# Patient Record
Sex: Male | Born: 1970 | Race: White | Hispanic: No | Marital: Married | Smoking: Never smoker
Health system: Southern US, Community
[De-identification: ages and names within clinical notes are randomized; demographics above are authoritative.]

## PROBLEM LIST (undated history)

## (undated) DIAGNOSIS — Z85828 Personal history of other malignant neoplasm of skin: Secondary | ICD-10-CM

## (undated) DIAGNOSIS — B009 Herpesviral infection, unspecified: Secondary | ICD-10-CM

## (undated) DIAGNOSIS — E785 Hyperlipidemia, unspecified: Secondary | ICD-10-CM

## (undated) DIAGNOSIS — I10 Essential (primary) hypertension: Secondary | ICD-10-CM

## (undated) HISTORY — DX: Personal history of other malignant neoplasm of skin: Z85.828

## (undated) HISTORY — DX: Hyperlipidemia, unspecified: E78.5

## (undated) HISTORY — DX: Herpesviral infection, unspecified: B00.9

## (undated) HISTORY — DX: Essential (primary) hypertension: I10

---

## 1997-05-31 DIAGNOSIS — I10 Essential (primary) hypertension: Secondary | ICD-10-CM

## 1997-05-31 HISTORY — DX: Essential (primary) hypertension: I10

## 1998-08-05 ENCOUNTER — Encounter: Payer: Self-pay | Admitting: Family Medicine

## 1998-08-05 LAB — CONVERTED CEMR LAB: Blood Glucose, Fasting: 86 mg/dL

## 2000-03-31 ENCOUNTER — Encounter: Payer: Self-pay | Admitting: Family Medicine

## 2001-12-29 DIAGNOSIS — E785 Hyperlipidemia, unspecified: Secondary | ICD-10-CM

## 2001-12-29 HISTORY — DX: Hyperlipidemia, unspecified: E78.5

## 2002-01-10 ENCOUNTER — Encounter: Payer: Self-pay | Admitting: Family Medicine

## 2002-01-10 LAB — CONVERTED CEMR LAB: Blood Glucose, Fasting: 96 mg/dL

## 2003-04-08 ENCOUNTER — Encounter: Payer: Self-pay | Admitting: Family Medicine

## 2003-04-08 LAB — CONVERTED CEMR LAB: Blood Glucose, Fasting: 91 mg/dL

## 2004-05-14 ENCOUNTER — Encounter: Payer: Self-pay | Admitting: Family Medicine

## 2004-05-14 LAB — CONVERTED CEMR LAB
Blood Glucose, Fasting: 89 mg/dL
RBC count: 5.06 10*6/uL
WBC, blood: 4.9 10*3/uL

## 2005-07-14 ENCOUNTER — Ambulatory Visit: Payer: Self-pay | Admitting: Family Medicine

## 2005-10-10 ENCOUNTER — Ambulatory Visit: Payer: Self-pay | Admitting: Family Medicine

## 2005-10-10 LAB — CONVERTED CEMR LAB: WBC, blood: 5.7 10*3/uL

## 2005-10-11 ENCOUNTER — Ambulatory Visit: Payer: Self-pay | Admitting: Family Medicine

## 2006-01-06 ENCOUNTER — Ambulatory Visit: Payer: Self-pay | Admitting: Family Medicine

## 2006-07-11 ENCOUNTER — Ambulatory Visit: Payer: Self-pay | Admitting: Family Medicine

## 2006-07-17 ENCOUNTER — Ambulatory Visit: Payer: Self-pay | Admitting: Family Medicine

## 2006-10-12 ENCOUNTER — Ambulatory Visit: Payer: Self-pay | Admitting: Family Medicine

## 2006-10-16 ENCOUNTER — Ambulatory Visit: Payer: Self-pay | Admitting: Family Medicine

## 2007-10-11 ENCOUNTER — Ambulatory Visit: Payer: Self-pay | Admitting: Family Medicine

## 2007-10-11 ENCOUNTER — Encounter: Payer: Self-pay | Admitting: Family Medicine

## 2007-10-11 DIAGNOSIS — Z85828 Personal history of other malignant neoplasm of skin: Secondary | ICD-10-CM | POA: Insufficient documentation

## 2007-10-11 DIAGNOSIS — E7800 Pure hypercholesterolemia, unspecified: Secondary | ICD-10-CM | POA: Insufficient documentation

## 2007-10-11 DIAGNOSIS — B009 Herpesviral infection, unspecified: Secondary | ICD-10-CM | POA: Insufficient documentation

## 2007-10-11 DIAGNOSIS — I10 Essential (primary) hypertension: Secondary | ICD-10-CM | POA: Insufficient documentation

## 2007-10-11 LAB — CONVERTED CEMR LAB
Calcium: 9.5 mg/dL (ref 8.4–10.5)
Cholesterol: 172 mg/dL (ref 0–200)
GFR calc Af Amer: 88 mL/min
GFR calc non Af Amer: 72 mL/min
Glucose, Bld: 96 mg/dL (ref 70–99)
HDL: 48.4 mg/dL (ref >39.0)
LDL Cholesterol: 109 mg/dL — ABNORMAL HIGH (ref 0–99)
Potassium: 4.8 meq/L (ref 3.5–5.1)
Sodium: 140 meq/L (ref 135–145)
Total CHOL/HDL Ratio: 3.6
Triglycerides: 73 mg/dL (ref 0–149)

## 2007-10-22 ENCOUNTER — Ambulatory Visit: Payer: Self-pay | Admitting: Family Medicine

## 2008-06-19 ENCOUNTER — Ambulatory Visit: Payer: Self-pay | Admitting: Family Medicine

## 2008-06-19 DIAGNOSIS — M79609 Pain in unspecified limb: Secondary | ICD-10-CM | POA: Insufficient documentation

## 2008-10-20 ENCOUNTER — Encounter: Payer: Self-pay | Admitting: Family Medicine

## 2008-10-27 ENCOUNTER — Ambulatory Visit: Payer: Self-pay | Admitting: Family Medicine

## 2008-10-27 LAB — CONVERTED CEMR LAB
ALT: 28 units/L (ref 0–53)
AST: 33 units/L (ref 0–37)
Bilirubin, Direct: 0.1 mg/dL (ref 0.0–0.3)
CO2: 31 meq/L (ref 19–32)
Chloride: 107 meq/L (ref 96–112)
GFR calc non Af Amer: 100 mL/min
LDL Cholesterol: 95 mg/dL (ref 0–99)
Potassium: 4.5 meq/L (ref 3.5–5.1)
Sodium: 140 meq/L (ref 135–145)
Total Bilirubin: 0.9 mg/dL (ref 0.3–1.2)
Total CHOL/HDL Ratio: 2.8

## 2008-10-29 ENCOUNTER — Ambulatory Visit: Payer: Self-pay | Admitting: Family Medicine

## 2008-12-05 ENCOUNTER — Encounter: Payer: Self-pay | Admitting: Family Medicine

## 2008-12-05 HISTORY — PX: VASECTOMY: SHX75

## 2009-09-18 ENCOUNTER — Ambulatory Visit: Payer: Self-pay | Admitting: Family Medicine

## 2009-09-21 ENCOUNTER — Ambulatory Visit: Payer: Self-pay | Admitting: Internal Medicine

## 2009-09-23 ENCOUNTER — Encounter: Payer: Self-pay | Admitting: Family Medicine

## 2009-10-28 ENCOUNTER — Ambulatory Visit: Payer: Self-pay | Admitting: Family Medicine

## 2009-10-28 LAB — CONVERTED CEMR LAB
ALT: 40 units/L (ref 0–53)
Albumin: 4.2 g/dL (ref 3.5–5.2)
Alkaline Phosphatase: 47 units/L (ref 39–117)
Basophils Relative: 0.2 % (ref 0.0–3.0)
Bilirubin, Direct: 0.1 mg/dL (ref 0.0–0.3)
CO2: 30 meq/L (ref 19–32)
Calcium: 9.2 mg/dL (ref 8.4–10.5)
Chloride: 105 meq/L (ref 96–112)
Creatinine, Ser: 1 mg/dL (ref 0.4–1.5)
Eosinophils Relative: 2.3 % (ref 0.0–5.0)
Glucose, Bld: 89 mg/dL (ref 70–99)
HCT: 44.1 % (ref 39.0–52.0)
HDL: 56.7 mg/dL (ref >39.00)
Hemoglobin: 14.6 g/dL (ref 13.0–17.0)
Lymphs Abs: 1.6 10*3/uL (ref 0.7–4.0)
MCV: 94.7 fL (ref 78.0–100.0)
Monocytes Absolute: 0.4 10*3/uL (ref 0.1–1.0)
Monocytes Relative: 8.3 % (ref 3.0–12.0)
Neutro Abs: 2.6 10*3/uL (ref 1.4–7.7)
Platelets: 183 10*3/uL (ref 150.0–400.0)
Total CHOL/HDL Ratio: 3
Total Protein: 6.9 g/dL (ref 6.0–8.3)
WBC: 4.7 10*3/uL (ref 4.5–10.5)

## 2009-11-02 ENCOUNTER — Ambulatory Visit: Payer: Self-pay | Admitting: Family Medicine

## 2010-06-08 ENCOUNTER — Encounter (INDEPENDENT_AMBULATORY_CARE_PROVIDER_SITE_OTHER): Payer: Self-pay | Admitting: *Deleted

## 2010-09-09 ENCOUNTER — Ambulatory Visit: Payer: Self-pay | Admitting: Family Medicine

## 2010-09-09 DIAGNOSIS — J020 Streptococcal pharyngitis: Secondary | ICD-10-CM | POA: Insufficient documentation

## 2010-10-01 ENCOUNTER — Ambulatory Visit: Payer: Self-pay | Admitting: Internal Medicine

## 2010-10-28 ENCOUNTER — Telehealth (INDEPENDENT_AMBULATORY_CARE_PROVIDER_SITE_OTHER): Payer: Self-pay | Admitting: *Deleted

## 2010-11-02 ENCOUNTER — Ambulatory Visit
Admission: RE | Admit: 2010-11-02 | Discharge: 2010-11-02 | Payer: Self-pay | Source: Home / Self Care | Attending: Family Medicine | Admitting: Family Medicine

## 2010-11-02 LAB — CONVERTED CEMR LAB
AST: 110 units/L — ABNORMAL HIGH (ref 0–37)
Albumin: 4.3 g/dL (ref 3.5–5.2)
BUN: 26 mg/dL — ABNORMAL HIGH (ref 6–23)
Calcium: 9.5 mg/dL (ref 8.4–10.5)
Cholesterol: 185 mg/dL (ref 0–200)
Creatinine, Ser: 0.9 mg/dL (ref 0.4–1.5)
GFR calc non Af Amer: 101.75 mL/min (ref >60.00)
Glucose, Bld: 87 mg/dL (ref 70–99)
HDL: 56.8 mg/dL (ref >39.00)
Triglycerides: 38 mg/dL (ref 0.0–149.0)
VLDL: 7.6 mg/dL (ref 0.0–40.0)

## 2010-11-04 ENCOUNTER — Ambulatory Visit
Admission: RE | Admit: 2010-11-04 | Discharge: 2010-11-04 | Payer: Self-pay | Source: Home / Self Care | Attending: Family Medicine | Admitting: Family Medicine

## 2010-11-30 NOTE — Assessment & Plan Note (Signed)
Summary: CPX/CLE   Vital Signs:  Patient profile:   40 year old male Weight:      226 pounds BMI:     29.12 Temp:     98.7 degrees F oral Pulse rate:   68 / minute Pulse rhythm:   regular BP sitting:   138 / 80  (left arm) Cuff size:   regular  Vitals Entered By: Linde Gillis CMA Duncan Dull) (November 02, 2009 8:14 AM) CC: 30 minute exam, tetanus given today   History of Present Illness: Nicholas Vasquez here for Comp Exam...having stress at work.  When yawning recently has had pain in left ear. He has also had some recurrent Sebaceous activity of the left forearm and right posterior neck.  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 Prn 2)  Norvasc 5 Mg  Tabs (Amlodipine Besylate) .Marland Kitchen.. 1 Daily By Mouth 3)  Lipitor 20 Mg  Tabs (Atorvastatin Calcium) .... 1/2 Tablet At Bedtime  Allergies (verified): No Known Drug Allergies  Past History:  Past Medical History: Last updated: 10/11/2007 Hyperlipidemia:(12/2001) Hypertension:(05/1997)  Past Surgical History: Last updated: 10/29/2008 Vas (Dr Achilles Dunk) 12/05/08  Family History: Last updated: 11/02/2009 Father: A 69 STROKE CABG IN 1993-- CHF// HEART TRANSPLANT [employer] 1998 CHOLEY PROSTATE CANCER AT 40 YOA ? RADIATION Mother: ALIVE 71   GRANDPARENTS : MATERNALGM A 95 MGF 10-16-93// PGM DECEASED RENAL FAILURE SISTER A 43 CHOL CV:  +HEART TRANSPLANT + CABG X 4 -- CHF (FATHER PGF CAD'S 50'S HBP: NEGATIVE DM: NEGATIVE GOUT/ARTHRITIS: PROSTATE CANCER: + FATHER BREAST/OVARIAN/UTERINE CANCER: NEGATIVE COLON CANCER: DEPRESSION: NEGATIVE ETOH/DRUG ABUSE: NEGATIVE OTHER: STROKE NEGATIVE  Social History: Last updated: 10/29/2008 Marital Status: Married  1996 Children: 2  CHILDREN 6     4 Occupation: SYSTEMS ENGINEER, [employer]  Risk Factors: Alcohol Use: <1 (11/02/2009) Caffeine Use: 3 (11/02/2009) Exercise: yes (11/02/2009)  Risk Factors: Smoking Status: never (11/02/2009) Passive Smoke Exposure: no (11/02/2009)  Family History: Father: A 32 STROKE CABG IN 1993-- CHF// HEART TRANSPLANT [employer] 1998 CHOLEY PROSTATE CANCER AT 23 YOA ? RADIATION Mother: ALIVE 72   GRANDPARENTS : MATERNALGM A 95 MGF 1993-10-16// PGM DECEASED RENAL FAILURE SISTER A 43 CHOL CV: +HEART TRANSPLANT + CABG X 4 -- CHF (FATHER PGF CAD'S 50'S HBP: NEGATIVE DM: NEGATIVE GOUT/ARTHRITIS: PROSTATE CANCER: + FATHER BREAST/OVARIAN/UTERINE CANCER: NEGATIVE COLON CANCER: DEPRESSION: NEGATIVE ETOH/DRUG ABUSE: NEGATIVE OTHER: STROKE NEGATIVE  Review of Systems General:  Denies chills, fatigue, fever, sweats, weakness, and weight loss. Eyes:  Denies blurring, discharge, and eye pain. ENT:  Complains of earache; denies decreased hearing and ringing in ears; occas per hpi. CV:  Denies chest pain or discomfort, difficulty breathing while lying down, fainting, fatigue, palpitations, and shortness of breath with exertion. Resp:  Denies cough, shortness of breath, and wheezing. GI:  Denies abdominal pain, bloody stools, change in bowel habits, constipation, dark tarry stools, diarrhea, indigestion, loss of appetite, nausea, vomiting, vomiting blood, and yellowish skin color. GU:  Denies discharge, dysuria, nocturia, and urinary frequency. MS:  Complains of muscle aches; denies joint pain, low back pain, and cramps; LEFT POST NECK. Derm:   Denies dryness, itching, and rash; sEE hpi. Neuro:  Complains of numbness; denies poor balance, tingling, and tremors; LEFT FINGERS OFF AND ON. ASSSOC W/ NECK.Marland Kitchen  Physical Exam  General:  Well-developed,well-nourished,in no acute distress; alert,appropriate and cooperative throughout examination Head:  Normocephalic and atraumatic without obvious abnormalities. No apparent alopecia or balding. Sinuses NT. Eyes:  Conjunctiva clear bilaterally.  Ears:  External ear exam shows no significant lesions or deformities.  Otoscopic examination reveals clear canals, tympanic membranes are intact bilaterally without bulging, retraction, inflammation or discharge. Hearing is grossly normal bilaterally. Nose:  External nasal examination shows no deformity or inflammation. Nasal mucosa are pink and moist without lesions or exudates. Mouth:  Oral mucosa and oropharynx without lesions or exudates.  Teeth in good repair. Neck:  No deformities, masses, or tenderness noted. Chest Wall:  No deformities, masses, tenderness or gynecomastia noted. Breasts:  No masses or gynecomastia noted Lungs:  Normal respiratory effort, chest expands symmetrically. Lungs are clear to auscultation, no crackles or wheezes. Heart:  Normal rate and regular rhythm. S1 and S2 normal without gallop, murmur, click, rub or other extra sounds. Abdomen:  Bowel sounds positive,abdomen soft and non-tender without masses, organomegaly or hernias noted. Rectal:  No external abnormalities noted. Normal sphincter tone. No rectal masses or tenderness. G neg. Genitalia:  Testes bilaterally descended without nodularity, tenderness or masses. No scrotal masses or lesions except varicocele on the left.  No penis lesions or urethral discharge. Prostate:  Prostate gland firm and smooth, no enlargement, nodularity, tenderness, mass, asymmetry or induration. 20gms. Msk:  No deformity or scoliosis noted of thoracic or lumbar spine.   Pulses:  R and L  carotid,radial,femoral,dorsalis pedis and posterior tibial pulses are full and equal bilaterally Extremities:  No clubbing, cyanosis, edema, or deformity noted with normal full range of motion of all joints.   Neurologic:  No cranial nerve deficits noted. Station and gait are normal. Plantar reflexes are down-going bilaterally. DTRs are symmetrical throughout. Sensory, motor and coordinative functions appear intact. Skin:  Intact without suspicious lesions or rashes. Three isolated lesions that appear to be very small resolving Sebaceous cysts Cervical Nodes:  No lymphadenopathy noted Inguinal Nodes:  No significant adenopathy Psych:  Cognition and judgment appear intact. Alert and cooperative with normal attention span and concentration. No apparent delusions, illusions, hallucinations   Impression & Recommendations:  Problem # 1:  HEALTH MAINTENANCE EXAM (ICD-V70.0) Tdap today.  Problem # 2:  ARM PAIN, LEFT (ICD-729.5) Assessment: Unchanged Stable, recurs at times as flare.  Problem # 3:  HERPES SIMPLEX INFECTION, RECURRENT (ICD-054.9) Assessment: Unchanged Sent Valtrex script.  Problem # 4:  HYPERCHOLESTEROLEMIA (ICD-272.0) Assessment: Unchanged  Adequate on current meds. His updated medication list for this problem includes:    Lipitor 20 Mg Tabs (Atorvastatin calcium) .Marland Kitchen... 1/2 tablet at bedtime  Labs Reviewed: SGOT: 44 (10/28/2009)   SGPT: 40 (10/28/2009)   HDL:56.70 (10/28/2009), 56.9 (10/27/2008)  LDL:124 (10/28/2009), 95 (04/54/0981)  Chol:193 (10/28/2009), 159 (10/27/2008)  Trig:60.0 (10/28/2009), 34 (10/27/2008)  Problem # 5:  HYPERTENSION (ICD-401.9) Assessment: Unchanged OK control. Continue. His updated medication list for this problem includes:    Norvasc 5 Mg Tabs (Amlodipine besylate) .Marland Kitchen... 1 daily  by mouth  BP today: 138/80 Prior BP: 120/80 (10/29/2008)  Labs Reviewed: K+: 4.5 (10/28/2009) Creat: : 1.0 (10/28/2009)   Chol: 193 (10/28/2009)   HDL: 56.70  (10/28/2009)   LDL: 124 (10/28/2009)   TG: 60.0 (10/28/2009)  Complete Medication List: 1)  Valtrex 500 Mg Tabs (Valacyclovir hcl) .... One tab by mouth qd 2)  Norvasc 5 Mg Tabs (Amlodipine besylate) .Marland Kitchen.. 1 daily by mouth 3)  Lipitor 20 Mg Tabs (Atorvastatin calcium) .... 1/2 tablet at bedtime  Other Orders: Tdap => 70yrs IM (04540) Admin 1st Vaccine (98119)  Patient Instructions: 1)  RTC as needed. One year otherwise. Prescriptions: VALTREX 500 MG  TABS (VALACYCLOVIR HCL) ONE TAB by mouth QD  #90 x 4   Entered and Authorized by:   Shaune Leeks MD   Signed by:   Shaune Leeks MD on 11/02/2009   Method used:   Print then Give to Patient   RxID:   [id number] LIPITOR 20 MG  TABS (ATORVASTATIN CALCIUM) 1/2 tablet at bedtime  #45 x 4   Entered and Authorized by:   Shaune Leeks MD   Signed by:   Shaune Leeks MD on 11/02/2009   Method used:   Print then Give to Patient   RxID:   [id number] NORVASC 5 MG  TABS (AMLODIPINE BESYLATE) 1 daily by mouth  #90 x 4   Entered and Authorized by:   Shaune Leeks MD   Signed by:   Shaune Leeks MD on 11/02/2009   Method used:   Print then Give to Patient   RxID:   [id number]   Current Allergies (reviewed today): No known allergies    Immunizations Administered:  Tetanus Vaccine:    Vaccine Type: Tdap    Site: left deltoid    Mfr: GlaxoSmithKline    Dose: 0.5 ml    Route: IM    Given by: Linde Gillis CMA (AAMA)    Exp. Date: 12/26/2011    Lot #: [id number]    VIS given: 09/18/07 version given November 02, 2009.

## 2010-11-30 NOTE — Assessment & Plan Note (Signed)
Summary: STREP THROAT  CYD   Vital Signs:  Patient profile:   40 year old male Height:      74 inches Weight:      213 pounds BMI:     27.45 Temp:     98.3 degrees F oral Pulse rate:   64 / minute Pulse rhythm:   regular BP sitting:   122 / 70  (left arm) Cuff size:   large  Vitals Entered By: Delilah Shan CMA Duncan Dull) (September 09, 2010 9:07 AM) CC: ? Strep throat   History of Present Illness: Daughter with strep.  Sx started Monday.  ST, fever Tuesday.  Noted LA per patient.  No cough.  Taking ibuprofen and this helps temporarily.   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  Otherwise negative.    Physical Exam  General:  GEN: nad, alert and oriented HEENT: mucous membranes moist, TM w/o erythema, nasal epithelium not injected, OP with cobblestoning and tonsillar exudates NECK: supple w/LA that is tender to palpation  CV: rrr. PULM: ctab, no inc wob   Impression & Recommendations:  Problem # 1:  STREPTOCOCCAL PHARYNGITIS (ICD-034.0) Strep.  Tolerated amoxil prev.  NKDA.  Supportive tx.  NSAIDS precaution.  follow up as needed.  He agrees.  His updated medication list for this problem includes:    Amoxicillin 875 Mg Tabs (Amoxicillin) .Marland Kitchen... 1 by mouth two times a day x10days    Lidocaine Viscous 2 % Soln (Lidocaine hcl) .Marland KitchenMarland KitchenMarland KitchenMarland Kitchen 5ml swish and spit out every 4 hours as needed for sore throat  Complete Medication List: 1)  Valtrex 500 Mg Tabs (Valacyclovir hcl) .... One tab by mouth once daily as needed 2)  Norvasc 5 Mg Tabs (Amlodipine besylate) .Marland Kitchen.. 1 daily by mouth 3)  Lipitor 20 Mg Tabs (Atorvastatin calcium) .... 1/2 tablet at bedtime 4)  Multivitamins Tabs (Multiple vitamin) .... Take 1 tablet by mouth once a day 5)  Amoxicillin 875 Mg Tabs (Amoxicillin) .Marland Kitchen.. 1 by mouth two times a day  x10days 6)  Lidocaine Viscous 2 % Soln (Lidocaine hcl) .... 5ml swish and spit out every 4 hours as needed for sore throat  Patient Instructions: 1)  Start the antibiotics today, use ibuprofen for pain, and try to get some rest.  Keep drinking plenty of fluids and take the ibuprofen with food.  I would use the lidocaine for your throat as needed.  Warm salt water gargles can also help with pain.  Take care.  Prescriptions: LIDOCAINE VISCOUS 2 % SOLN (LIDOCAINE HCL) 5ml swish and spit out every 4 hours as needed for sore throat  #15ml x 0   Entered and Authorized by:   Crawford Givens MD   Signed by:   Crawford Givens MD on 09/09/2010   Method used:   [facility]ron[facility] to        [facility] [phone]* (retail)       [address] [place], [place]  [address]       Ph: [phone] or [phone]       Fax: [phone]   RxID:   [id number] AMOXICILLIN 875 MG TABS (AMOXICILLIN) 1 by mouth two times a day x10days  #20 x 0  Entered and Authorized by:   Crawford Givens MD   Signed by:   Crawford Givens MD on 09/09/2010   Method used:   [facility] to        [facility] [phone]* (retail)       [address] [place], [place]  [address]       Ph: [phone] or [phone]       Fax: [phone]   RxID:   [id number]    Orders Added: 1)  Est. Patient Level III [74259]    Current Allergies (reviewed today): No known allergies   Laboratory Results  Date/Time Received: September 09, 2010 9:22 AM   Other Tests  Rapid Strep: positive

## 2010-11-30 NOTE — Letter (Signed)
Summary: Nicholas Vasquez letter  [facility]  [address] [place], [place] [address]   Phone: [phone]  Fax: [phone]       06/08/2010 MRN: [id number]  Nicholas Vasquez [address] [place], [place]  [address]  Dear Mr. Nicholas Vasquez [facility] [place], and [facility] announce the retirement of Arta Silence, M.D., from full-time practice at the [facility] office effective April 29, 2010 and his plans of returning part-time.  It is important to Dr. Hetty Ely and to our practice that you understand that [facility] has seven physicians in our office for your health care needs.  We will continue to offer the same exceptional care that you have today.    Dr. Hetty Ely has spoken to many of you about his plans for retirement and returning part-time in the fall.   We will continue to work with you through the transition to schedule appointments for you in the office and meet the high standards that [place] is committed to.   Again, it is with great pleasure that we share the news that Dr. Hetty Ely will return to [facility] in October of 2011 with a reduced schedule.    If you have any questions, or would like to request an appointment with one of our physicians, please call us at [phone] and press the option for Scheduling an appointment.  We take pleasure in providing you with excellent patient care and look forward to seeing you at your next office visit.  Our [facility] Physicians are:  Tillman Abide, M.D. Laurita Quint, M.D. Roxy Manns, M.D. Kerby Nora, M.D. Hannah Beat, M.D. Ruthe Mannan, M.D. We proudly welcomed Raechel Ache, M.D. and Eustaquio Boyden, M.D. to the practice in July/August 2011.  Sincerely,  [facility]

## 2010-11-30 NOTE — Assessment & Plan Note (Signed)
Summary: SORE THROAT   Vital Signs:  Patient profile:   40 year old male Height:      74 inches Weight:      214.50 pounds BMI:     27.64 Temp:     98.7 degrees F oral Pulse rate:   68 / minute Pulse rhythm:   regular BP sitting:   116 / 72  (left arm) Cuff size:   large  Vitals Entered By: Linde Gillis CMA Duncan Dull) (October 01, 2010 4:19 PM) CC: sore throat   History of Present Illness: Started with sore throat aobut 3 days ago felt pretty sick 2 nights ago noted white spots in mouth  treated for strep about 1 month ago  then felt fine daughter is sick again also  No fever No rash No sig cough slight head congestion Has noted some swollen glands in neck--ibuprofen helping some  Allergies (verified): No Known Drug Allergies  Past History:  Past medical, surgical, family and social histories (including risk factors) reviewed for relevance to current acute and chronic problems.  Past Medical History: Reviewed history from 10/11/2007 and no changes required. Hyperlipidemia:(12/2001) Hypertension:(05/1997)  Past Surgical History: Reviewed history from 10/29/2008 and no changes required. Vas (Dr Achilles Dunk) 12/05/08  Family History: Reviewed history from 11/02/2009 and no changes required. Father: A 86 STROKE CABG IN 1993-- CHF// HEART TRANSPLANT [employer] 1998 CHOLEY PROSTATE CANCER AT 37 YOA ? RADIATION Mother: ALIVE 72   GRANDPARENTS : MATERNALGM A 95 MGF Oct 17, 1993// PGM DECEASED RENAL FAILURE SISTER A 43 CHOL CV: +HEART TRANSPLANT + CABG X 4 -- CHF (FATHER PGF CAD'S 50'S HBP: NEGATIVE DM: NEGATIVE GOUT/ARTHRITIS: PROSTATE CANCER: + FATHER BREAST/OVARIAN/UTERINE CANCER: NEGATIVE COLON CANCER: DEPRESSION: NEGATIVE ETOH/DRUG ABUSE: NEGATIVE OTHER: STROKE NEGATIVE  Social History: Reviewed history from 09/09/2010 and no changes required. Marital Status: Married  1996 Children: 2  CHILDREN 6     4 Occupation: SYSTEMS ENGINEER, [employer]  grad  Review of Systems       No vomiting or diarrhea eating okay but some pain with swallowing  Physical Exam  General:  alert.  NAD Head:  no sinus tenderness Ears:  R ear normal and L ear normal.   Nose:  mild congestion only Mouth:  2-3+ tonsils without sig exudate but markedly inflamed Marked palatal petechiae Neck:  supple.  Small non tender bilat cervical nodes Lungs:  normal respiratory effort, no intercostal retractions, no accessory muscle use, and normal breath sounds.     Impression & Recommendations:  Problem # 1:  STREPTOCOCCAL PHARYNGITIS (ICD-034.0) Assessment Comment Only  recurrent doesn't seem to be treatment failure--more likely reinfection discussed management for his children as well will treat with PCN but at higher than typical dose  Orders: Rapid Strep (52841)  Complete Medication List: 1)  Valtrex 500 Mg Tabs (Valacyclovir hcl) .... One tab by mouth once daily as needed 2)  Norvasc 5 Mg Tabs (Amlodipine besylate) .Marland Kitchen.. 1 daily by mouth 3)  Lipitor 20 Mg Tabs (Atorvastatin calcium) .... 1/2 tablet at bedtime 4)  Multivitamins Tabs (Multiple vitamin) .... Take 1 tablet by mouth once a day 5)  Lidocaine Viscous 2 % Soln (Lidocaine hcl) .... 5ml swish and spit out every 4 hours as needed for sore throat 6)  Penicillin V Potassium 500 Mg Tabs (Penicillin v potassium) .... 2 tabs by mouth two times a day for strep infection  Patient Instructions: 1)  Please schedule a follow-up appointment as needed .  Prescriptions: PENICILLIN  V POTASSIUM 500 MG TABS (PENICILLIN V POTASSIUM) 2 tabs by mouth two times a day for strep infection  #40 x 0   Entered and Authorized by:   Cindee Salt MD   Signed by:   Cindee Salt MD on 10/01/2010   Method used:   [facility]ron[facility] to        [facility] [phone]* (retail)       [address] [place], [place]  [address]       Ph: [phone] or [phone]       Fax: [phone]    RxID:   [id number]    Orders Added: 1)  Est. Patient Level III [59563] 2)  Rapid Strep [87564]    Current Allergies (reviewed today): No known allergies   Laboratory Results    Other Tests  Rapid Strep: positive  Kit Test Internal QC: Positive   (Normal Range: Negative)

## 2010-12-02 NOTE — Progress Notes (Signed)
----   Converted from flag ---- ---- 10/27/2010 1:10 PM, Crawford Givens MD wrote: cmet/lipid 401.1  ---- 10/27/2010 9:54 AM, Liane Comber CMA (AAMA) wrote: Lab orders please! Good Morning! This pt is scheduled for cpx labs Monday, which labs to draw and dx codes to use? Thanks Tasha ------------------------------

## 2010-12-02 NOTE — Assessment & Plan Note (Signed)
Summary: CPX/TRANSFER FROM DR SCHALLER/CLE   Vital Signs:  Patient profile:   40 year old male Height:      74 inches Weight:      219 pounds BMI:     28.22 Temp:     98.7 degrees F oral Pulse rate:   76 / minute Pulse rhythm:   regular BP sitting:   138 / 70  (left arm) Cuff size:   large  Vitals Entered By: Delilah Shan CMA Remigio Mcmillon Dull) (November 04, 2010 8:35 AM) CC: CPX - Transfer from [facility]  -  Needs RF's to [facility] on Lipitor and Norvasc (90 day supplies)   History of Present Illness: CPE- see prev med.   HSV: episodic flares.  Doing well with the medicine.  Usually with good control and rare flares, though he has one now on L nare.  Elevated Cholesterol: Using medications without problems:yes Muscle aches: no Other complaints:mild elevation in LFTs d/w patient.    Hypertension:      Using medication without problems or lightheadedness: yes Chest pain with exertion:no Edema:no Short of breath:no Other issues: no  Some insomnia- there is a family history of this.  Travel for work.  He thinks it is stress related/exacerbated. His father and sister are similar.  Occ takes otc nighttime sleep aid with some relief.    Allergies: No Known Drug Allergies  Past History:  Past Surgical History: Last updated: 10/29/2008 Vas (Dr Achilles Dunk) 12/05/08  Past Medical History: Hyperlipidemia:(12/2001) Hypertension:(05/1997) HSV- oral, episodic tx as needed.    Family History: Reviewed history from 11/02/2009 and no changes required. Father: A STROKE CABG IN 1993-- CHF// HEART TRANSPLANT [facility] 1998 [place], [place] at age 11, Prostate CA at 31 PROSTATE CANCER AT 95 YOA,  RADIATION Mother: ALIVE  GRANDPARENTS : MATERNALGM A 95 MGF 10-Nov-1993// PGM DECEASED RENAL FAILURE SISTER A 43 CHOL CV: +HEART TRANSPLANT + CABG X 4 -- CHF (FATHER PGF CAD'S 50'S HBP: NEGATIVE DM: NEGATIVE GOUT/ARTHRITIS: PROSTATE CANCER: + FATHER BREAST/OVARIAN/UTERINE CANCER: NEGATIVE COLON CANCER: DEPRESSION:  NEGATIVE ETOH/DRUG ABUSE: NEGATIVE OTHER: STROKE NEGATIVE  Social History: Reviewed history from 09/09/2010 and no changes required. 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  no tob alcohol: 2 glasses of wine/night  Review of Systems       See HPI.  Otherwise negative.    Physical Exam  General:  GEN: nad, alert and oriented HEENT: mucous membranes moist NECK: supple w/o LA CV: rrr.  no murmur PULM: ctab, no inc wob ABD: soft, +bs, not tender to palpation, no HSM EXT: no edema SKIN: no acute rash except for small crusted lesion in L nare at external inferior border.    Impression & Recommendations:  Problem # 1:  HEALTH MAINTENANCE EXAM (ICD-V70.0) Colon CA and prostate CA screen at 50.  Flu shot encouraged.  Exercising.  Has a healthy diet. labs reviewed with patient and all okay except for LFTs.   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    Problem # 3:  HYPERCHOLESTEROLEMIA (ICD-272.0) No change in meds.  LFT elevation may be due to episodic alcohol, heavy exercise before labs drawn, or statin.  Will decrease alcohol and not exercise heavily before the recheck.  No jaundice.  He agrees with plan.  His updated medication list for this problem includes:  Lipitor 20 Mg Tabs (Atorvastatin calcium) .Marland Kitchen... 1/2 tablet at bedtime  Orders: Prescription Created Electronically [phone])  Problem # 4:  HYPERTENSION (ICD-401.9) controlled, no change in meds.  Keep exercising.  His updated medication list for this problem includes:    Norvasc 5 Mg Tabs (Amlodipine besylate) .Marland Kitchen... 1 daily by mouth  Complete Medication List: 1)  Valtrex 1 Gm Tabs (Valacyclovir hcl) .... 2 by mouth two times a day x1 day if symptoms occur 2)  Norvasc 5 Mg Tabs (Amlodipine  besylate) .Marland Kitchen.. 1 daily by mouth 3)  Lipitor 20 Mg Tabs (Atorvastatin calcium) .... 1/2 tablet at bedtime 4)  Multivitamins Tabs (Multiple vitamin) .... Take 1 tablet by mouth once a day  Patient Instructions: 1)  I would come back for a recheck on LFTs in 1 months.  dx 272.0. 2)  Avoid heavy work outs a few days before the lab draw.   You don't need to fast.  I would cut back to 1 glass of wine a day or less. Take care.  Glad to see you.  I would get a flu shot at a pharmacy.  Prescriptions: LIPITOR 20 MG  TABS (ATORVASTATIN CALCIUM) 1/2 tablet at bedtime  #45 x 4   Entered and Authorized by:   Crawford Givens MD   Signed by:   Crawford Givens MD on 11/04/2010   Method used:   Faxed to ...       [facility] (mail-order)             , [place]         Ph: [phone]       Fax: [phone]   RxID:   [id number] NORVASC 5 MG  TABS (AMLODIPINE BESYLATE) 1 daily by mouth  #90 x 4   Entered and Authorized by:   Crawford Givens MD   Signed by:   Crawford Givens MD on 11/04/2010   Method used:   Faxed to ...       [facility] (mail-order)             , [place]         Ph: [phone]       Fax: [phone]   RxID:   [id number] VALTREX 1 GM TABS (VALACYCLOVIR HCL) 2 by mouth two times a day x1 day if symptoms occur  #20 x 1   Entered and Authorized by:   Crawford Givens MD   Signed by:   Crawford Givens MD on 11/04/2010   Method used:   Faxed to ...       [facility] (mail-order)             , [place]         Ph: [phone]       Fax: [phone]   RxID:   [id number]    Orders Added: 1)  Est. Patient 40-64 years [99396] 2)  Est. Patient Level III [71062] 3)  Prescription Created Electronically [phone]    Current Allergies (reviewed today): No known allergies

## 2010-12-07 ENCOUNTER — Encounter (INDEPENDENT_AMBULATORY_CARE_PROVIDER_SITE_OTHER): Payer: Self-pay | Admitting: *Deleted

## 2010-12-07 ENCOUNTER — Other Ambulatory Visit (INDEPENDENT_AMBULATORY_CARE_PROVIDER_SITE_OTHER): Payer: BC Managed Care – PPO

## 2010-12-07 ENCOUNTER — Other Ambulatory Visit: Payer: Self-pay | Admitting: Family Medicine

## 2010-12-07 DIAGNOSIS — E7800 Pure hypercholesterolemia, unspecified: Secondary | ICD-10-CM

## 2010-12-07 LAB — HEPATIC FUNCTION PANEL
ALT: 28 U/L (ref 0–53)
Bilirubin, Direct: 0.1 mg/dL (ref 0.0–0.3)
Total Protein: 7 g/dL (ref 6.0–8.3)

## 2010-12-24 ENCOUNTER — Encounter: Payer: Self-pay | Admitting: Family Medicine

## 2010-12-24 ENCOUNTER — Ambulatory Visit (INDEPENDENT_AMBULATORY_CARE_PROVIDER_SITE_OTHER): Payer: BC Managed Care – PPO | Admitting: Family Medicine

## 2010-12-24 DIAGNOSIS — J020 Streptococcal pharyngitis: Secondary | ICD-10-CM

## 2010-12-28 NOTE — Assessment & Plan Note (Signed)
Summary: ST/CLE   [facility]   Vital Signs:  Patient profile:   40 year old male Height:      74 inches Weight:      220 pounds BMI:     28.35 Temp:     98.5 degrees F oral Pulse rate:   60 / minute Pulse rhythm:   regular BP sitting:   122 / 80  (left arm) Cuff size:   large  Vitals Entered By: Delilah Shan CMA Christie Copley Dull) (December 24, 2010 9:26 AM) CC: ST   History of Present Illness: RST pos.  Sick contacts.  Sx started yesterday afternoon. ST, aches last night.  No clear fever but didn't sleep well. No cough.  No LA noted by patient.    Allergies: No Known Drug Allergies  Review of Systems       See HPI.  Otherwise negative.    Physical Exam  General:  no apparent distress normocephalic atraumatic tm wnl nasal exam wnl mucous membranes moist with erythema in posterior OP neck supple with shotty LA regular rate and rhythm clear to auscultation bilaterally   Impression & Recommendations:  Problem # 1:  STREPTOCOCCAL PHARYNGITIS (ICD-034.0) RST pos.  Start amoxil and supportive tx o/w.  He has some viscous lidocaine left over.  follow up as needed.  He understood.  His updated medication list for this problem includes:    Amoxicillin 875 Mg Tabs (Amoxicillin) .Marland Kitchen... 1 by mouth two times a day  Orders: Prescription Created Electronically [id number])  Complete Medication List: 1)  Valtrex 1 Gm Tabs (Valacyclovir hcl) .... 2 by mouth two times a day x1 day if symptoms occur 2)  Norvasc 5 Mg Tabs (Amlodipine besylate) .Marland Kitchen.. 1 daily by mouth 3)  Lipitor 20 Mg Tabs (Atorvastatin calcium) .... 1/2 tablet at bedtime 4)  Multivitamins Tabs (Multiple vitamin) .... Take 1 tablet by mouth once a day 5)  Amoxicillin 875 Mg Tabs (Amoxicillin) .Marland Kitchen.. 1 by mouth two times a day  Other Orders: Rapid Strep (98119)  Patient Instructions: 1)  Start the antibiotics today, use the lidocaine as needed, gargle with salt water and try to get some rest.  This should gradually get better.     Prescriptions: AMOXICILLIN 875 MG TABS (AMOXICILLIN) 1 by mouth two times a day  #20 x 0   Entered and Authorized by:   Crawford Givens MD   Signed by:   Crawford Givens MD on 12/24/2010   Method used:   Electronically to        [facility] [phone]* (retail)       [address] [place], [place]  [address]       Ph: [phone] or [phone]       Fax: [phone]   RxID:   [id number]    Orders Added: 1)  Rapid Strep [87564] 2)  Prescription Created Electronically [[id number]] 3)  Est. Patient Level III [33295]    Current Allergies (reviewed today): No known allergies   Laboratory Results  Date/Time Received: December 24, 2010 9:33 AM   Other Tests  Rapid Strep: positive

## 2011-06-06 ENCOUNTER — Encounter: Payer: Self-pay | Admitting: Family Medicine

## 2011-06-07 ENCOUNTER — Encounter: Payer: Self-pay | Admitting: Family Medicine

## 2011-06-07 ENCOUNTER — Ambulatory Visit (INDEPENDENT_AMBULATORY_CARE_PROVIDER_SITE_OTHER): Payer: BC Managed Care – PPO | Admitting: Family Medicine

## 2011-06-07 VITALS — BP 140/82 | HR 70 | Temp 99.0°F | Wt 218.8 lb

## 2011-06-07 DIAGNOSIS — M25519 Pain in unspecified shoulder: Secondary | ICD-10-CM

## 2011-06-07 NOTE — Patient Instructions (Signed)
See Shirlee Limerick about your referral before your leave today.  Take ibuprofen 200mg , 2-3 tabs with food 2-3 times a day as needed.

## 2011-06-07 NOTE — Progress Notes (Signed)
Shoulder pain.  Was playing volleyball months ago.  The next morning he had R shoulder pain.  Going for about 2-3 weeks, was waking at night.  Now 12-14 weeks out.  Would get better and then he'd have some pain after activity.  He's been icing and resting but he still has some symptoms.  It feels better with massage.  He didn't feel a pop or snap. No discrete injury recently.  R handed.  H/o R shoulder injury in distant past.   Meds, vitals, and allergies reviewed.   ROS: See HPI.  Otherwise, noncontributory.  nad ncat Neck supple and w/o midline pain.  Ne has good rom for flex/ext but less rom for lateral tilt due to muscle pain Shoulder with normal inspection and GH joint doesn't feel lax.  R shoulder with normal rom except for dec in rom when trying to cross the arm across the chest.  He has pain with abduction but not forward flexion.   Pain with Subscap testing with int rotation, but no pain with ext rotation.  + impingement Distally NV intact

## 2011-06-08 DIAGNOSIS — M25519 Pain in unspecified shoulder: Secondary | ICD-10-CM | POA: Insufficient documentation

## 2011-06-08 NOTE — Assessment & Plan Note (Signed)
Likely cuff pathology with subscap component.  No laxity.  Okay for PT.  He would prefer this.  Use nsaids with GI caution in meantime.  Anatomy d/w pt and he understood.  Call back if not improved with PT.

## 2011-10-26 ENCOUNTER — Other Ambulatory Visit: Payer: Self-pay | Admitting: Family Medicine

## 2011-10-26 DIAGNOSIS — E7800 Pure hypercholesterolemia, unspecified: Secondary | ICD-10-CM

## 2011-11-03 ENCOUNTER — Other Ambulatory Visit (INDEPENDENT_AMBULATORY_CARE_PROVIDER_SITE_OTHER): Payer: BC Managed Care – PPO

## 2011-11-03 DIAGNOSIS — E7800 Pure hypercholesterolemia, unspecified: Secondary | ICD-10-CM

## 2011-11-03 LAB — COMPREHENSIVE METABOLIC PANEL
AST: 32 U/L (ref 0–37)
BUN: 20 mg/dL (ref 6–23)
Calcium: 9.3 mg/dL (ref 8.4–10.5)
Chloride: 106 mEq/L (ref 96–112)
Creatinine, Ser: 1 mg/dL (ref 0.4–1.5)
Glucose, Bld: 99 mg/dL (ref 70–99)

## 2011-11-03 LAB — LIPID PANEL
Cholesterol: 192 mg/dL (ref 0–200)
HDL: 64.2 mg/dL (ref >39.00)
Total CHOL/HDL Ratio: 3
Triglycerides: 58 mg/dL (ref 0.0–149.0)

## 2011-11-08 ENCOUNTER — Encounter: Payer: Self-pay | Admitting: Family Medicine

## 2011-11-08 ENCOUNTER — Ambulatory Visit (INDEPENDENT_AMBULATORY_CARE_PROVIDER_SITE_OTHER): Payer: BC Managed Care – PPO | Admitting: Family Medicine

## 2011-11-08 VITALS — BP 124/72 | HR 57 | Temp 98.4°F | Wt 221.8 lb

## 2011-11-08 DIAGNOSIS — B009 Herpesviral infection, unspecified: Secondary | ICD-10-CM

## 2011-11-08 DIAGNOSIS — M25519 Pain in unspecified shoulder: Secondary | ICD-10-CM

## 2011-11-08 DIAGNOSIS — E7800 Pure hypercholesterolemia, unspecified: Secondary | ICD-10-CM

## 2011-11-08 DIAGNOSIS — I10 Essential (primary) hypertension: Secondary | ICD-10-CM

## 2011-11-08 DIAGNOSIS — Z0000 Encounter for general adult medical examination without abnormal findings: Secondary | ICD-10-CM

## 2011-11-08 DIAGNOSIS — Z23 Encounter for immunization: Secondary | ICD-10-CM

## 2011-11-08 MED ORDER — ATORVASTATIN CALCIUM 20 MG PO TABS: 10.0000 mg | ORAL_TABLET | Freq: Every day | ORAL | Status: DC

## 2011-11-08 MED ORDER — VALACYCLOVIR HCL 1 G PO TABS: 2000.0000 mg | ORAL_TABLET | Freq: Two times a day (BID) | ORAL | Status: DC

## 2011-11-08 MED ORDER — AMLODIPINE BESYLATE 5 MG PO TABS: 5.0000 mg | ORAL_TABLET | Freq: Every day | ORAL | Status: DC

## 2011-11-08 NOTE — Progress Notes (Signed)
CPE- See plan.  Routine anticipatory guidance given to patient.  See health maintenance.  Recent URI.  No fevers.  Had minimal cough, no rhinorrhea.  Some better now.  Started 11/02/11.    Occ oral/perioral HSV flares.  Controlled with prn valtrex, no ADE or complications.  Doing well.   Shoulder pain.  He went to PT for shoulder pain.  Was improved.  Does well with cardio, but weights may cause some pain.  He got some better with PT.  Ibuprofen helps some.  Asking for advice.   Elevated Cholesterol: Using medications without problems:yes Muscle aches: none attributable to meds Diet compliance:yes Exercise:yes  Hypertension:    Using medication without problems or lightheadedness: yes Chest pain with exertion:no Edema:no Short of breath:no  Has had derm f/u re: h/o SCC.    PMH and SH reviewed  Meds, vitals, and allergies reviewed.   ROS: See HPI.  Otherwise negative.    GEN: nad, alert and oriented HEENT: mucous membranes moist, tm wnl, minimal nasal congestion NECK: supple w/o LA CV: rrr. PULM: ctab, no inc wob ABD: soft, +bs EXT: no edema SKIN: no acute rash

## 2011-11-08 NOTE — Patient Instructions (Addendum)
Schedule an appointment with Dr. Patsy Lager to see what he thinks about your shoulder.   Keep running and don't change your meds.  Take care.  Glad to see you.

## 2011-11-10 ENCOUNTER — Encounter: Payer: Self-pay | Admitting: Family Medicine

## 2011-11-10 DIAGNOSIS — Z0000 Encounter for general adult medical examination without abnormal findings: Secondary | ICD-10-CM | POA: Insufficient documentation

## 2011-11-10 NOTE — Assessment & Plan Note (Signed)
Okay for flu shot today, d/w pt about diet and exercise.  No need for early prostate/colon CA screening. Healthy habits encouraged.

## 2011-11-10 NOTE — Assessment & Plan Note (Signed)
See prev notes, I'd like pt to see Dr. Patsy Lager and get his advice.

## 2011-11-10 NOTE — Assessment & Plan Note (Signed)
Controlled , no change in meds 

## 2011-11-10 NOTE — Assessment & Plan Note (Signed)
Use prn valtrex .

## 2011-11-14 ENCOUNTER — Encounter: Payer: Self-pay | Admitting: Family Medicine

## 2011-11-14 ENCOUNTER — Ambulatory Visit (INDEPENDENT_AMBULATORY_CARE_PROVIDER_SITE_OTHER)
Admission: RE | Admit: 2011-11-14 | Discharge: 2011-11-14 | Disposition: A | Discharge status: Home / Self Care | Payer: BC Managed Care – PPO | Source: Ambulatory Visit | Attending: Family Medicine | Admitting: Family Medicine

## 2011-11-14 ENCOUNTER — Ambulatory Visit (INDEPENDENT_AMBULATORY_CARE_PROVIDER_SITE_OTHER): Payer: BC Managed Care – PPO | Admitting: Family Medicine

## 2011-11-14 VITALS — BP 130/72 | HR 63 | Temp 98.9°F | Ht 74.0 in | Wt 221.8 lb

## 2011-11-14 DIAGNOSIS — M7540 Impingement syndrome of unspecified shoulder: Secondary | ICD-10-CM

## 2011-11-14 DIAGNOSIS — M948X9 Other specified disorders of cartilage, unspecified sites: Secondary | ICD-10-CM

## 2011-11-14 DIAGNOSIS — M7520 Bicipital tendinitis, unspecified shoulder: Secondary | ICD-10-CM

## 2011-11-14 DIAGNOSIS — M67929 Unspecified disorder of synovium and tendon, unspecified upper arm: Secondary | ICD-10-CM

## 2011-11-14 DIAGNOSIS — G2589 Other specified extrapyramidal and movement disorders: Secondary | ICD-10-CM

## 2011-11-14 DIAGNOSIS — M25519 Pain in unspecified shoulder: Secondary | ICD-10-CM

## 2011-11-14 DIAGNOSIS — M24119 Other articular cartilage disorders, unspecified shoulder: Secondary | ICD-10-CM

## 2011-11-14 DIAGNOSIS — M25819 Other specified joint disorders, unspecified shoulder: Secondary | ICD-10-CM

## 2011-11-14 DIAGNOSIS — M25511 Pain in right shoulder: Secondary | ICD-10-CM

## 2011-11-14 DIAGNOSIS — R279 Unspecified lack of coordination: Secondary | ICD-10-CM

## 2011-11-14 DIAGNOSIS — M24819 Other specific joint derangements of unspecified shoulder, not elsewhere classified: Secondary | ICD-10-CM

## 2011-11-14 NOTE — Progress Notes (Signed)
Patient Name: Nicholas Vasquez Date of Birth: 16-May-1971 Medical Record Number: [id number] Gender: male  Dear Dr. Para March,  Thank you for having me see Nicholas Vasquez in consultation today at [facility] at [facility] for his problem with right-sided shoulder pain.  As you may recall, he is a 41 y.o. year old male with a history of right-sided shoulder pain that has been ongoing off and on since April, 2012. One night he awoke after playing volleyball and had some pain in her shoulder, and this would go on off and on for a number of weeks.  Historically, he has been very active and plays a lot of volleyball as well as some tennis. He lives weights routinely.   Right now he is having pain in his a.c. Joint, and he is also having pain with certain movements in the gym, in particular he will have pain after lifting certain movements in the gym, particularly flies, military presses, bench presses, and dips. He has altered his routine, and gotten his pain under control with ibuprofen. He did go to physical therapy at [facility], and did some classic internal/external rotation and rotator cuff strengthening.  Started in 01/2011 - no signle one injury, but that night, woke up sore and that night for about four weeks, it would wake him up.  In august, came to see Dr. Algis Downs - went to went to see Kristen at PT.   Weights: 4-5 times a week, will do some cardio. After cardio --- pull-ups, dips, flyes, occ seated militatry press with dumbbells.  Some seated pulls.   At 41 years old, -- ? Fracture. Possible clavicle fracture, but he is not really sure.   Past Medical History  Diagnosis Date  . Hyperlipidemia 12-2001  . Hypertension 05-1997  . HSV infection     oral- episodic, tx as needed     Past Surgical History  Procedure Date  . Vasectomy 12/05/08    Dr. Achilles Dunk     History   Social History  . Marital Status: Married    Spouse Name: N/A    Number of Children: 2  . Years of Education: N/A    Occupational History  . systems engineer,[employer]      [place] [employer]   Social History Main Topics  . Smoking status: Never Smoker   . Smokeless tobacco: None  . Alcohol Use: Yes     2 glasses of wine/night   . Drug Use: None  . Sexually Active: None   Other Topics Concern  . None   Social History Narrative   Exercise- 12 miles running per week, treadmill, and some weightsMarried 1996, 2 daughtersSYSTEMS ENGINEER, [employer] grad    Family History  Problem Relation Age of Onset  . Stroke Father   . Heart attack Father 39    s/p heart transplant  . Prostate cancer Father 30  . Kidney failure Maternal Grandfather   . Coronary artery disease Paternal Grandfather   . Hypertension Neg Hx   . Diabetes Neg Hx   . Breast cancer Neg Hx   . Ovarian cancer Neg Hx   . Uterine cancer Neg Hx   . Depression Neg Hx   . Drug abuse Neg Hx   . Alcohol abuse Neg Hx     Medications and Allergies reviewed  Review of Systems:    GEN: No fevers, chills. Nontoxic. Primarily MSK c/o today. MSK: Detailed in the HPI GI: tolerating PO intake without difficulty Neuro:  No numbness, parasthesias, or tingling associated. Otherwise the pertinent positives of the ROS are noted above.    Physical Examination: Filed Vitals:   11/14/11 1556  BP: 130/72  Pulse: 63  Temp: 98.9 F (37.2 C)  TempSrc: Oral  Height: 6\' 2"  (1.88 m)  Weight: 221 lb 12.8 oz (100.608 kg)  SpO2: 99%      GEN: WDWN, NAD, Non-toxic, Alert & Oriented x 3 HEENT: Atraumatic, Normocephalic.  Ears and Nose: No external deformity. EXTR: No clubbing/cyanosis/edema NEURO: Normal gait.  PSYCH: Normally interactive. Conversant. Not depressed or anxious appearing.  Calm demeanor.   Shoulder: R Inspection: No muscle wasting - scapula rolled forward L snapping scapula Ecchymosis/edema: neg  AC joint, scapula, clavicle: R AC TTP Cervical spine: NT, full ROM Spurling's: neg Abduction:  full, 5/5 Flexion: full, 5/5 IR, full, lift-off: 5/5 ER at neutral: full, 5/5 AC crossover and compression: POS on R Neer: pos Hawkins: mild pos Drop Test: neg Empty Can: neg Supraspinatus insertion: NT Bicipital groove: TTP Speed's: pos Yergason's: pos Sulcus sign: neg Scapular dyskinesis: some flairing with abd from behind C5-T1 intact Sensation intact Grip 5/5   Assessment and Plan:  Impression:  1. Right shoulder pain  DG Shoulder Right  2. Scapular dyskinesis    3. Snapping scapula    4. AC joint pain    5. Biceps tendinopathy    6. Shoulder impingement        Recommendations: classic a.c. Joint pain and impingement often seen in individuals with developed pectoralis musculature as well as deltoids with long-term weightlifting. Relative insufficiency of scapular stabilization. Suspected this is mechanically causing some impingement. Of note, he is doing some of the worst exercises that are notorious for causing shoulder impingement from a weight standpoint.  >30 minutes spent in face to face time with patient, >50% spent in counselling or coordination of care: A.c. Joint is certainly also involved. We reviewed various types of conservative versus aggressive management. Persistent  A.c. Joint problems classically do very well with shoulder arthroscopy. For now, I spent a significant amount of time reviewing scapular stabilization with the patient. We're going to alter his weightlifting program, having focus on the scapular stabilization and recheck in 6-8 weeks.   We will see the patient back in 6-8 weeks.  Thank you for having [place] see Zeev A Pomona in consultation.  Feel free to contact me with any questions.  Alexis Mizuno T. Apurva Reily, MD, CAQ Sports Medicine [facility] [employer] at [facility] [address] [place], [place] [address] Phone: [phone] Fax: [phone]

## 2011-11-14 NOTE — Patient Instructions (Signed)
[email or web address] ALL SHOULDER VIDEOS AND WORKOUT DO 3-4 TIMES A WEEK   Include the marked shoulder exercises on handout

## 2012-01-09 ENCOUNTER — Ambulatory Visit: Payer: BC Managed Care – PPO | Admitting: Family Medicine

## 2012-01-09 ENCOUNTER — Telehealth: Payer: Self-pay | Admitting: Family Medicine

## 2012-01-09 NOTE — Telephone Encounter (Signed)
Will see pt tomorrow.  

## 2012-01-09 NOTE — Telephone Encounter (Signed)
Triage Record Num: [id number] Operator: Baldomero Lamy Patient Name: Nicholas Vasquez Call Date & Time: 01/09/2012 8:39:52AM Patient Phone: [phone] PCP: Crawford Givens Patient Gender: Male PCP Fax : Patient DOB: November 13, 1970 Practice Name: [facility] Reason for Call: Caller: [name]/Patient; PCP: Crawford Givens Clelia Croft); CB#: [phone]; ; ; Call regarding Cough/Congestion; Pt calling regarding fever since 3/8 that has fluctuated between 99 and 102 with sore throat. Both daughters sick with same. Emergent sxs of Sore Throat r/o. Disp: 24 hrs. Appt made for 01/10/12 at 1500 with Dr. Para March. No appt before that. Relayed to pt with call back parameters. Protocol(s) Used: Sore Throat or Hoarseness Recommended Outcome per Protocol: See Provider within 24 hours Reason for Outcome: Temperature of 101.5 F(38.6 C) or greater Care Advice: ~ Inform provider of any previous reaction or sensitivity to penicillin. Avoid exposure to environmental irritants. Do not smoke and avoid second-hand smoke. Avoid outside activities on high pollution days. Instead of strong smelling commercial cleaning products, substitute vinegar and lemon juice. ~ ~ HEALTH PROMOTION / MAINTENANCE ~ SYMPTOM / CONDITION MANAGEMENT ~ CAUTIONS ~ List, or take, all current prescription(s), nonprescription or alternative medication(s) to provider for evaluation. ~ Call 911 if voice muffled, is unable to swallow own saliva and is drooling or choking sensation. 01/09/2012 8:51:35AM Page 1 of 1 [email or web address]

## 2012-01-10 ENCOUNTER — Ambulatory Visit (INDEPENDENT_AMBULATORY_CARE_PROVIDER_SITE_OTHER): Payer: BC Managed Care – PPO | Admitting: Family Medicine

## 2012-01-10 ENCOUNTER — Encounter: Payer: Self-pay | Admitting: Family Medicine

## 2012-01-10 VITALS — BP 130/74 | HR 86 | Temp 101.4°F | Wt 223.0 lb

## 2012-01-10 DIAGNOSIS — J020 Streptococcal pharyngitis: Secondary | ICD-10-CM

## 2012-01-10 MED ORDER — AMOXICILLIN 875 MG PO TABS: 875.0000 mg | ORAL_TABLET | Freq: Two times a day (BID) | ORAL | Status: AC

## 2012-01-10 NOTE — Patient Instructions (Signed)
Start the amoxil today.  This should improve.  Let us know if you want to see ENT.  Take care.

## 2012-01-10 NOTE — Progress Notes (Signed)
Sx started Friday. Mult family members with strep.  He has a ST, mild. Voice is altered.  Throat clearing but not a cough.  Temp up to 103.  Fever worse late afternoon.  No ear pain.  No diarrhea.  He has an appetite.  This is the 3rd episode of strep in last 2 years.    Given the history, we didn't do RST today.    Meds, vitals, and allergies reviewed.   ROS: See HPI.  Otherwise, noncontributory.  nad ncat Tm wnl Nasal exam w/o acute changes Irritated OP w/o exudates. No tender LA.  rrr ctab

## 2012-01-12 DIAGNOSIS — J020 Streptococcal pharyngitis: Secondary | ICD-10-CM | POA: Insufficient documentation

## 2012-01-12 NOTE — Assessment & Plan Note (Signed)
Presumed, treat.  He'll consider fu with ENT.

## 2012-01-13 ENCOUNTER — Telehealth: Payer: Self-pay | Admitting: *Deleted

## 2012-01-13 NOTE — Telephone Encounter (Signed)
Fever continues last night, improved this AM; this is the first AM w/o fever.  On ABX now.  He was asking about possible walking PNA.  I doubt this with the fever and his lungs being clear.  Will follow clinically.  He talked with ENT and it wouldn't be reasonable to pursue T&A at this point.  He'll call back as needed.

## 2012-01-13 NOTE — Telephone Encounter (Signed)
Patient called requesting a call from Dr. Para March regarding his last OV and the symptoms he is having.  He would like a call at your earliest convenience.

## 2012-01-23 ENCOUNTER — Ambulatory Visit: Payer: BC Managed Care – PPO | Admitting: Family Medicine

## 2012-10-23 ENCOUNTER — Other Ambulatory Visit: Payer: Self-pay | Admitting: *Deleted

## 2012-10-23 MED ORDER — ATORVASTATIN CALCIUM 20 MG PO TABS: 10.0000 mg | ORAL_TABLET | Freq: Every day | ORAL | Status: DC

## 2012-11-05 ENCOUNTER — Other Ambulatory Visit (INDEPENDENT_AMBULATORY_CARE_PROVIDER_SITE_OTHER): Payer: BC Managed Care – PPO

## 2012-11-05 ENCOUNTER — Other Ambulatory Visit: Payer: Self-pay | Admitting: Family Medicine

## 2012-11-05 DIAGNOSIS — E7800 Pure hypercholesterolemia, unspecified: Secondary | ICD-10-CM

## 2012-11-05 LAB — LIPID PANEL
HDL: 55.6 mg/dL (ref >39.00)
LDL Cholesterol: 111 mg/dL — ABNORMAL HIGH (ref 0–99)
Total CHOL/HDL Ratio: 3
Triglycerides: 46 mg/dL (ref 0.0–149.0)

## 2012-11-05 LAB — COMPREHENSIVE METABOLIC PANEL
Albumin: 4.2 g/dL (ref 3.5–5.2)
BUN: 21 mg/dL (ref 6–23)
Calcium: 9.3 mg/dL (ref 8.4–10.5)
Chloride: 104 mEq/L (ref 96–112)
Glucose, Bld: 90 mg/dL (ref 70–99)
Potassium: 4.8 mEq/L (ref 3.5–5.1)

## 2012-11-09 ENCOUNTER — Encounter: Payer: Self-pay | Admitting: Family Medicine

## 2012-11-09 ENCOUNTER — Ambulatory Visit (INDEPENDENT_AMBULATORY_CARE_PROVIDER_SITE_OTHER): Payer: BC Managed Care – PPO | Admitting: Family Medicine

## 2012-11-09 VITALS — BP 122/84 | HR 59 | Temp 98.7°F | Wt 230.0 lb

## 2012-11-09 DIAGNOSIS — I10 Essential (primary) hypertension: Secondary | ICD-10-CM

## 2012-11-09 DIAGNOSIS — Z0000 Encounter for general adult medical examination without abnormal findings: Secondary | ICD-10-CM

## 2012-11-09 DIAGNOSIS — E7800 Pure hypercholesterolemia, unspecified: Secondary | ICD-10-CM

## 2012-11-09 MED ORDER — AMLODIPINE BESYLATE 5 MG PO TABS: 5.0000 mg | ORAL_TABLET | Freq: Every day | ORAL | Status: DC

## 2012-11-09 MED ORDER — ATORVASTATIN CALCIUM 20 MG PO TABS: 10.0000 mg | ORAL_TABLET | Freq: Every day | ORAL | Status: DC

## 2012-11-09 NOTE — Progress Notes (Signed)
CPE- See plan.  Routine anticipatory guidance given to patient.  See health maintenance. Tetanus 2011 Flu shot not done yet.  Colon and prostate cancer screening not due. Diet and exercise discussed.   Living will discussed.  Wife would be designated if incapacitated.    His shoulder pain is much improved with exercise and balanced strengthening.  Is seeing a chiropractor with some relief for lower back pain.   Recently with some URI sx for last few days.  Took some pseudophed today.  Voice pitch dropped, no fevers.  Is resolving.    Hypertension:    Using medication without problems or lightheadedness: yes Chest pain with exertion:no Edema:no Short of breath:no Average home RUE:[id number] on home checks  Elevated Cholesterol: Using medications without problems:yes Muscle aches: not from myalgias Diet compliance:yes Exercise:yes  Meds, vitals, and allergies reviewed.   PMH and SH reviewed  ROS: See HPI.  Otherwise negative.    GEN: nad, alert and oriented HEENT: mucous membranes moist, tm w/o erythema, nasal exam w/o erythema, clear discharge noted,  OP with mild cobblestoning NECK: supple w/o LA CV: rrr.   PULM: ctab, no inc wob EXT: no edema SKIN: no acute rash

## 2012-11-09 NOTE — Patient Instructions (Signed)
I would get a flu shot when you are feeling better.  Take care.  Keep exercising.  Glad to see you.

## 2012-11-11 NOTE — Assessment & Plan Note (Signed)
Controlled, continue current meds and d/w pt about diet and exercise.  

## 2012-11-11 NOTE — Assessment & Plan Note (Signed)
Controlled, continue current meds and d/w pt about diet and exercise.

## 2012-11-11 NOTE — Assessment & Plan Note (Signed)
Routine anticipatory guidance given to patient.  See health maintenance. Tetanus 2011 Flu shot not done yet.  Colon and prostate cancer screening not due. Diet and exercise discussed.   Living will discussed.  Wife would be designated if incapacitated.

## 2013-06-04 ENCOUNTER — Telehealth: Payer: Self-pay | Admitting: Family Medicine

## 2013-06-04 MED ORDER — VALACYCLOVIR HCL 1 G PO TABS: 2000.0000 mg | ORAL_TABLET | Freq: Two times a day (BID) | ORAL | Status: DC

## 2013-06-04 NOTE — Telephone Encounter (Signed)
Sent. Thanks.   

## 2013-06-04 NOTE — Telephone Encounter (Signed)
Pt requesting valtrex refill.  He states that RX ran out in January.  Last OV 10/2012.

## 2013-06-05 NOTE — Telephone Encounter (Signed)
Patient notified that script has been sent to the pharmacy. ?

## 2013-11-19 ENCOUNTER — Other Ambulatory Visit: Payer: Self-pay | Admitting: Family Medicine

## 2013-11-19 DIAGNOSIS — E7800 Pure hypercholesterolemia, unspecified: Secondary | ICD-10-CM

## 2013-11-27 ENCOUNTER — Other Ambulatory Visit (INDEPENDENT_AMBULATORY_CARE_PROVIDER_SITE_OTHER): Payer: Managed Care, Other (non HMO)

## 2013-11-27 DIAGNOSIS — I10 Essential (primary) hypertension: Secondary | ICD-10-CM

## 2013-11-27 DIAGNOSIS — E7800 Pure hypercholesterolemia, unspecified: Secondary | ICD-10-CM

## 2013-11-27 LAB — LIPID PANEL
CHOL/HDL RATIO: 3
Cholesterol: 175 mg/dL (ref 0–200)
HDL: 57.3 mg/dL (ref >39.00)
LDL Cholesterol: 107 mg/dL — ABNORMAL HIGH (ref 0–99)
TRIGLYCERIDES: 53 mg/dL (ref 0.0–149.0)
VLDL: 10.6 mg/dL (ref 0.0–40.0)

## 2013-11-27 LAB — COMPREHENSIVE METABOLIC PANEL
ALK PHOS: 57 U/L (ref 39–117)
ALT: 26 U/L (ref 0–53)
AST: 29 U/L (ref 0–37)
Albumin: 4.3 g/dL (ref 3.5–5.2)
BILIRUBIN TOTAL: 0.5 mg/dL (ref 0.3–1.2)
BUN: 22 mg/dL (ref 6–23)
CALCIUM: 9.6 mg/dL (ref 8.4–10.5)
CHLORIDE: 104 meq/L (ref 96–112)
CO2: 29 mEq/L (ref 19–32)
CREATININE: 1.3 mg/dL (ref 0.4–1.5)
GFR: 65.05 mL/min (ref >60.00)
Glucose, Bld: 104 mg/dL — ABNORMAL HIGH (ref 70–99)
Potassium: 4.9 mEq/L (ref 3.5–5.1)
Sodium: 138 mEq/L (ref 135–145)
Total Protein: 7.5 g/dL (ref 6.0–8.3)

## 2013-12-04 ENCOUNTER — Encounter: Payer: BC Managed Care – PPO | Admitting: Family Medicine

## 2013-12-04 DIAGNOSIS — Z0289 Encounter for other administrative examinations: Secondary | ICD-10-CM

## 2013-12-16 ENCOUNTER — Ambulatory Visit (INDEPENDENT_AMBULATORY_CARE_PROVIDER_SITE_OTHER): Payer: Managed Care, Other (non HMO) | Admitting: Family Medicine

## 2013-12-16 ENCOUNTER — Encounter: Payer: Self-pay | Admitting: Family Medicine

## 2013-12-16 VITALS — BP 142/82 | HR 85 | Temp 98.3°F | Ht 74.0 in | Wt 233.0 lb

## 2013-12-16 DIAGNOSIS — Z0000 Encounter for general adult medical examination without abnormal findings: Secondary | ICD-10-CM

## 2013-12-16 DIAGNOSIS — E7800 Pure hypercholesterolemia, unspecified: Secondary | ICD-10-CM

## 2013-12-16 DIAGNOSIS — I10 Essential (primary) hypertension: Secondary | ICD-10-CM

## 2013-12-16 MED ORDER — AMLODIPINE BESYLATE 5 MG PO TABS: 5.0000 mg | ORAL_TABLET | Freq: Every day | ORAL | Status: DC

## 2013-12-16 MED ORDER — ATORVASTATIN CALCIUM 20 MG PO TABS: 10.0000 mg | ORAL_TABLET | Freq: Every day | ORAL | Status: DC

## 2013-12-16 NOTE — Progress Notes (Signed)
Pre-visit discussion using our clinic review tool. No additional management support is needed unless otherwise documented below in the visit note.  CPE- See plan.  Routine anticipatory guidance given to patient.  See health maintenance. Tetanus 2011 Flu shot encouraged.  PSA not due yet.   Colon cancer screening not due.   Diet and exercise d/w pt.  "My diet stinks, really."  Encouraged.     Living will d/w pt.  Wife designated if incapacitated.   Hypertension:    Using medication without problems or lightheadedness: yes Chest pain with exertion:no Edema:no Short of breath:no  Elevated Cholesterol: Using medications without problems:yes Muscle aches: no Diet compliance: see above Exercise:yes  Seeing Dr. Adolphus Birchwoodasher with derm, h/o SCC.  Had routine f/u.  He had a superficial fungal infection, treated.    PMH and SH reviewed  Meds, vitals, and allergies reviewed.   ROS: See HPI.  Otherwise negative.    GEN: nad, alert and oriented HEENT: mucous membranes moist NECK: supple w/o LA CV: rrr. PULM: ctab, no inc wob ABD: soft, +bs EXT: no edema SKIN: no acute rash

## 2013-12-16 NOTE — Patient Instructions (Signed)
I would get a flu shot each fall.   Take care.  Don't change your meds.  Glad to see you.

## 2013-12-17 NOTE — Assessment & Plan Note (Signed)
Reasonable control, continue work on diet and exercise.  Labs dw pt. No med changes. 

## 2013-12-17 NOTE — Assessment & Plan Note (Signed)
Reasonable control, continue work on diet and exercise.  Labs dw pt. No med changes.

## 2013-12-17 NOTE — Assessment & Plan Note (Signed)
Routine anticipatory guidance given to patient.  See health maintenance. Tetanus 2011 Flu shot encouraged.  PSA not due yet.   Colon cancer screening not due.   Diet and exercise d/w pt.  "My diet stinks, really."  Encouraged.     Living will d/w pt.  Wife designated if incapacitated.

## 2013-12-18 ENCOUNTER — Telehealth: Payer: Self-pay | Admitting: Family Medicine

## 2013-12-18 NOTE — Telephone Encounter (Signed)
Relevant patient education assigned to patient using Emmi. ° °

## 2014-01-22 ENCOUNTER — Telehealth: Payer: Self-pay

## 2014-01-22 NOTE — Telephone Encounter (Signed)
That's okay to try.  Have him update us.  Thanks.

## 2014-01-22 NOTE — Telephone Encounter (Signed)
Patient notified as instructed by telephone. Patient stated that he will see how things go and will report back regarding his blood sugar readings.

## 2014-01-22 NOTE — Telephone Encounter (Signed)
Pt left v/m; pt was seen 12/2013;BS was elevated and pt wants to know if can stop Lipitor and monitor BS and diet to see if BS will come down off Lipitor.pt request cb.

## 2014-05-05 ENCOUNTER — Other Ambulatory Visit: Payer: Self-pay

## 2014-05-05 NOTE — Telephone Encounter (Signed)
pts wife left v/m requesting refill [facility] for valacyclovir for fever blisters.Please advise.

## 2014-05-06 MED ORDER — VALACYCLOVIR HCL 1 G PO TABS: 2000.0000 mg | ORAL_TABLET | Freq: Two times a day (BID) | ORAL | Status: DC

## 2014-05-06 NOTE — Telephone Encounter (Signed)
Sent!

## 2014-12-07 ENCOUNTER — Other Ambulatory Visit: Payer: Self-pay | Admitting: Family Medicine

## 2014-12-07 DIAGNOSIS — E7800 Pure hypercholesterolemia, unspecified: Secondary | ICD-10-CM

## 2014-12-09 ENCOUNTER — Other Ambulatory Visit (INDEPENDENT_AMBULATORY_CARE_PROVIDER_SITE_OTHER): Payer: Managed Care, Other (non HMO)

## 2014-12-09 DIAGNOSIS — E7800 Pure hypercholesterolemia, unspecified: Secondary | ICD-10-CM

## 2014-12-09 LAB — LIPID PANEL
CHOL/HDL RATIO: 3
Cholesterol: 167 mg/dL (ref 0–200)
HDL: 57.6 mg/dL (ref >39.00)
LDL Cholesterol: 95 mg/dL (ref 0–99)
NonHDL: 109.4
Triglycerides: 74 mg/dL (ref 0.0–149.0)
VLDL: 14.8 mg/dL (ref 0.0–40.0)

## 2014-12-09 LAB — COMPREHENSIVE METABOLIC PANEL
ALT: 31 U/L (ref 0–53)
AST: 40 U/L — ABNORMAL HIGH (ref 0–37)
Albumin: 4.2 g/dL (ref 3.5–5.2)
Alkaline Phosphatase: 53 U/L (ref 39–117)
BILIRUBIN TOTAL: 0.8 mg/dL (ref 0.2–1.2)
BUN: 18 mg/dL (ref 6–23)
CO2: 29 mEq/L (ref 19–32)
CREATININE: 1.06 mg/dL (ref 0.40–1.50)
Calcium: 9.3 mg/dL (ref 8.4–10.5)
Chloride: 103 mEq/L (ref 96–112)
GFR: 80.49 mL/min (ref >60.00)
GLUCOSE: 91 mg/dL (ref 70–99)
Potassium: 4.1 mEq/L (ref 3.5–5.1)
Sodium: 138 mEq/L (ref 135–145)
Total Protein: 7 g/dL (ref 6.0–8.3)

## 2014-12-16 ENCOUNTER — Encounter: Payer: Self-pay | Admitting: Family Medicine

## 2014-12-16 ENCOUNTER — Ambulatory Visit (INDEPENDENT_AMBULATORY_CARE_PROVIDER_SITE_OTHER): Payer: Managed Care, Other (non HMO) | Admitting: Family Medicine

## 2014-12-16 VITALS — BP 140/82 | HR 62 | Temp 98.3°F | Ht 74.0 in | Wt 234.0 lb

## 2014-12-16 DIAGNOSIS — B009 Herpesviral infection, unspecified: Secondary | ICD-10-CM

## 2014-12-16 DIAGNOSIS — Z7189 Other specified counseling: Secondary | ICD-10-CM

## 2014-12-16 DIAGNOSIS — E785 Hyperlipidemia, unspecified: Secondary | ICD-10-CM

## 2014-12-16 DIAGNOSIS — E7800 Pure hypercholesterolemia, unspecified: Secondary | ICD-10-CM

## 2014-12-16 DIAGNOSIS — I10 Essential (primary) hypertension: Secondary | ICD-10-CM

## 2014-12-16 DIAGNOSIS — Z0000 Encounter for general adult medical examination without abnormal findings: Secondary | ICD-10-CM

## 2014-12-16 MED ORDER — ATORVASTATIN CALCIUM 10 MG PO TABS: 5.0000 mg | ORAL_TABLET | Freq: Every day | ORAL | Status: DC

## 2014-12-16 MED ORDER — AMLODIPINE BESYLATE 5 MG PO TABS: 5.0000 mg | ORAL_TABLET | Freq: Every day | ORAL | Status: DC

## 2014-12-16 MED ORDER — VALACYCLOVIR HCL 1 G PO TABS: 2000.0000 mg | ORAL_TABLET | Freq: Two times a day (BID) | ORAL | Status: DC

## 2014-12-16 NOTE — Assessment & Plan Note (Signed)
Controlled, reasonable to try lower dose of statin, down to 5mg  a day for now.  Recheck lipids at lab visit in about 2 months.  Continue work on D&E.  He agrees.

## 2014-12-16 NOTE — Progress Notes (Signed)
Pre visit review using our clinic review tool, if applicable. No additional management support is needed unless otherwise documented below in the visit note.  CPE- See plan.  Routine anticipatory guidance given to patient.  See health maintenance.  Tetanus 2011 Flu shot encouraged.  PSA not due yet.  Colon cancer screening not due.  Diet and exercise d/w pt. running.  Diet is "okay".  He is working on both.  Encouraged. Living will d/w pt. Wife designated if incapacitated.   Hypertension:    Using medication without problems or lightheadedness: yes Chest pain with exertion:no Edema:no Short of breath:no  He has f/u with derm pending for day after tomorrow.   Elevated Cholesterol: Using medications without problems: yes Muscle aches: no Diet compliance: see above Exercise: yes Labs d/w pt.    PMH and SH reviewed  Meds, vitals, and allergies reviewed.   ROS: See HPI.  Otherwise negative.    GEN: nad, alert and oriented HEENT: mucous membranes moist NECK: supple w/o LA CV: rrr. PULM: ctab, no inc wob ABD: soft, +bs EXT: no edema SKIN: no acute rash

## 2014-12-16 NOTE — Patient Instructions (Signed)
Recheck lipids at a lab visit in about 2 months.  Cut the lipitor back to 5mg  for now.  Take care.  Glad to see you.  Keep exercising.  I would get a flu shot each fall.

## 2014-12-16 NOTE — Assessment & Plan Note (Signed)
Oral and nasal lesion, hx of.  Continue prn valtrex.

## 2014-12-16 NOTE — Assessment & Plan Note (Signed)
Controlled, continue as is.  Labs d/w pt.  Continue work on D&E.  He agrees.

## 2014-12-16 NOTE — Assessment & Plan Note (Signed)
Routine anticipatory guidance given to patient. See health maintenance.  Tetanus 2011  Flu shot encouraged.  PSA not due yet.  Colon cancer screening not due.  Diet and exercise d/w pt. running. Diet is "okay". He is working on both. Encouraged.  Living will d/w pt. Wife designated if incapacitated.

## 2015-01-07 ENCOUNTER — Other Ambulatory Visit: Payer: Self-pay

## 2015-01-07 MED ORDER — VALACYCLOVIR HCL 1 G PO TABS: 2000.0000 mg | ORAL_TABLET | Freq: Two times a day (BID) | ORAL | Status: DC

## 2015-01-08 ENCOUNTER — Other Ambulatory Visit: Payer: Self-pay | Admitting: *Deleted

## 2015-01-08 NOTE — Telephone Encounter (Signed)
Faxed refill request.   This is a request from his mail order pharmacy so it needs to be a 90 day supply with RF's.  Please advise.  20 tablet 3 01/07/2015

## 2015-01-09 MED ORDER — VALACYCLOVIR HCL 1 G PO TABS: 2000.0000 mg | ORAL_TABLET | Freq: Two times a day (BID) | ORAL | Status: DC

## 2015-01-09 NOTE — Telephone Encounter (Signed)
I sent it as is.  If needing more frequently, we would have to change the dose anyway.  Thanks.

## 2015-02-10 ENCOUNTER — Other Ambulatory Visit: Payer: Self-pay | Admitting: Family Medicine

## 2015-02-19 ENCOUNTER — Other Ambulatory Visit (INDEPENDENT_AMBULATORY_CARE_PROVIDER_SITE_OTHER): Payer: Managed Care, Other (non HMO)

## 2015-02-19 DIAGNOSIS — E785 Hyperlipidemia, unspecified: Secondary | ICD-10-CM | POA: Diagnosis not present

## 2015-02-19 LAB — LIPID PANEL
CHOL/HDL RATIO: 3
Cholesterol: 170 mg/dL (ref 0–200)
HDL: 54.5 mg/dL (ref >39.00)
LDL CALC: 106 mg/dL — AB (ref 0–99)
NonHDL: 115.5
TRIGLYCERIDES: 47 mg/dL (ref 0.0–149.0)
VLDL: 9.4 mg/dL (ref 0.0–40.0)

## 2015-02-23 ENCOUNTER — Encounter: Payer: Self-pay | Admitting: *Deleted

## 2015-12-10 ENCOUNTER — Other Ambulatory Visit: Payer: Self-pay | Admitting: *Deleted

## 2015-12-10 MED ORDER — AMLODIPINE BESYLATE 5 MG PO TABS: 5.0000 mg | ORAL_TABLET | Freq: Every day | ORAL | Status: DC

## 2015-12-10 MED ORDER — ATORVASTATIN CALCIUM 20 MG PO TABS: 10.0000 mg | ORAL_TABLET | Freq: Every day | ORAL | Status: DC

## 2015-12-14 ENCOUNTER — Other Ambulatory Visit: Payer: Managed Care, Other (non HMO)

## 2015-12-21 ENCOUNTER — Encounter: Payer: Managed Care, Other (non HMO) | Admitting: Family Medicine

## 2016-01-14 ENCOUNTER — Other Ambulatory Visit: Payer: Self-pay | Admitting: Family Medicine

## 2016-01-14 DIAGNOSIS — E7800 Pure hypercholesterolemia, unspecified: Secondary | ICD-10-CM

## 2016-01-20 ENCOUNTER — Other Ambulatory Visit (INDEPENDENT_AMBULATORY_CARE_PROVIDER_SITE_OTHER): Payer: 59

## 2016-01-20 DIAGNOSIS — E7800 Pure hypercholesterolemia, unspecified: Secondary | ICD-10-CM

## 2016-01-20 LAB — COMPREHENSIVE METABOLIC PANEL
ALBUMIN: 4.5 g/dL (ref 3.5–5.2)
ALT: 23 U/L (ref 0–53)
AST: 24 U/L (ref 0–37)
Alkaline Phosphatase: 45 U/L (ref 39–117)
BILIRUBIN TOTAL: 1 mg/dL (ref 0.2–1.2)
BUN: 18 mg/dL (ref 6–23)
CO2: 30 mEq/L (ref 19–32)
Calcium: 9.6 mg/dL (ref 8.4–10.5)
Chloride: 104 mEq/L (ref 96–112)
Creatinine, Ser: 1.15 mg/dL (ref 0.40–1.50)
GFR: 72.9 mL/min (ref >60.00)
GLUCOSE: 96 mg/dL (ref 70–99)
Potassium: 4.2 mEq/L (ref 3.5–5.1)
Sodium: 140 mEq/L (ref 135–145)
Total Protein: 7.2 g/dL (ref 6.0–8.3)

## 2016-01-20 LAB — LIPID PANEL
CHOLESTEROL: 161 mg/dL (ref 0–200)
HDL: 52.3 mg/dL (ref >39.00)
LDL Cholesterol: 99 mg/dL (ref 0–99)
NONHDL: 108.77
Total CHOL/HDL Ratio: 3
Triglycerides: 49 mg/dL (ref 0.0–149.0)
VLDL: 9.8 mg/dL (ref 0.0–40.0)

## 2016-01-25 ENCOUNTER — Encounter: Payer: Self-pay | Admitting: Family Medicine

## 2016-01-25 ENCOUNTER — Ambulatory Visit (INDEPENDENT_AMBULATORY_CARE_PROVIDER_SITE_OTHER): Payer: 59 | Admitting: Family Medicine

## 2016-01-25 VITALS — BP 120/80 | HR 62 | Temp 98.5°F | Ht 74.0 in | Wt 234.5 lb

## 2016-01-25 DIAGNOSIS — E7800 Pure hypercholesterolemia, unspecified: Secondary | ICD-10-CM

## 2016-01-25 DIAGNOSIS — I10 Essential (primary) hypertension: Secondary | ICD-10-CM

## 2016-01-25 DIAGNOSIS — Z0000 Encounter for general adult medical examination without abnormal findings: Secondary | ICD-10-CM

## 2016-01-25 MED ORDER — VALACYCLOVIR HCL 1 G PO TABS: 2000.0000 mg | ORAL_TABLET | Freq: Two times a day (BID) | ORAL | Status: DC

## 2016-01-25 MED ORDER — ATORVASTATIN CALCIUM 20 MG PO TABS: 10.0000 mg | ORAL_TABLET | Freq: Every day | ORAL | Status: DC

## 2016-01-25 MED ORDER — AMLODIPINE BESYLATE 5 MG PO TABS: 5.0000 mg | ORAL_TABLET | Freq: Every day | ORAL | Status: DC

## 2016-01-25 NOTE — Progress Notes (Signed)
Pre visit review using our clinic review tool, if applicable. No additional management support is needed unless otherwise documented below in the visit note.  CPE- See plan.  Routine anticipatory guidance given to patient.  See health maintenance. Tetanus 2011 Flu shot encouraged for fall 2017 PSA not due yet.D/w pt.   Colon cancer screening not due.  d/w pt.   Diet and exercise d/w pt. running. Diet is "okay". He is working on both. Encouraged. Living will d/w pt. Wife designated if incapacitated.  [facility]    Hypertension:    Using medication without problems or lightheadedness: yes Chest pain with exertion:no Edema:no Short of breath:no  Elevated Cholesterol: Using medications without problems:yes Muscle aches: no Diet compliance: yes Exercise: yes  Recent URI sx with sick contacts noted.  No FCNAVD.  Mild voice changes, "a garden variety head cold."    PMH and SH reviewed  Meds, vitals, and allergies reviewed.   ROS: See HPI.  Otherwise negative.    GEN: nad, alert and oriented HEENT: mucous membranes moist, TM wnl, OP wnl NECK: supple w/o LA CV: rrr. PULM: ctab, no inc wob ABD: soft, +bs EXT: no edema SKIN: no acute rash

## 2016-01-25 NOTE — Patient Instructions (Signed)
Take care.  Glad to see you.  Keep exercising.  Don't change your meds.   Update me as needed.

## 2016-01-26 NOTE — Assessment & Plan Note (Signed)
Tetanus 2011  Flu shot encouraged for fall 2017  PSA not due yet. D/w pt.  Colon cancer screening not due. d/w pt.  Diet and exercise d/w pt. running. Diet is "okay". He is working on both. Encouraged.  Living will d/w pt. Wife designated if incapacitated.  [facility]

## 2016-01-26 NOTE — Assessment & Plan Note (Signed)
Continue current meds.  No ADE.  Continue work on diet and exercise.  Labs d/w pt.  He agrees.   

## 2016-01-26 NOTE — Assessment & Plan Note (Signed)
Continue current meds.  No ADE.  Continue work on diet and exercise.  Labs d/w pt.  He agrees.

## 2016-09-05 ENCOUNTER — Other Ambulatory Visit: Payer: Self-pay | Admitting: *Deleted

## 2016-09-05 MED ORDER — AMLODIPINE BESYLATE 5 MG PO TABS: 5.0000 mg | ORAL_TABLET | Freq: Every day | ORAL | 1 refills | Status: DC

## 2016-09-05 MED ORDER — VALACYCLOVIR HCL 1 G PO TABS: 2000.0000 mg | ORAL_TABLET | Freq: Two times a day (BID) | ORAL | 1 refills | Status: DC

## 2016-09-05 MED ORDER — ATORVASTATIN CALCIUM 20 MG PO TABS: 10.0000 mg | ORAL_TABLET | Freq: Every day | ORAL | 1 refills | Status: DC

## 2017-01-22 ENCOUNTER — Other Ambulatory Visit: Payer: Self-pay | Admitting: Family Medicine

## 2017-01-22 DIAGNOSIS — E7800 Pure hypercholesterolemia, unspecified: Secondary | ICD-10-CM

## 2017-01-23 ENCOUNTER — Telehealth: Payer: Self-pay

## 2017-01-23 MED ORDER — OSELTAMIVIR PHOSPHATE 75 MG PO CAPS: 75.0000 mg | ORAL_CAPSULE | Freq: Every day | ORAL | 0 refills | Status: DC

## 2017-01-23 NOTE — Telephone Encounter (Signed)
Pt left v/m; pts daughter was dx with influenza A on 01/22/17. Pt request preventative rx for tamiflu; [facility] [address]

## 2017-01-23 NOTE — Telephone Encounter (Signed)
Left message on voicemail for patient to call back. 

## 2017-01-23 NOTE — Telephone Encounter (Signed)
[phone] Pt returned your call

## 2017-01-23 NOTE — Telephone Encounter (Signed)
Start with daily dosing.  If flu sx, then change to BID dosing.  Thanks.  Sent.

## 2017-01-23 NOTE — Telephone Encounter (Signed)
Patient notified as instructed by telephone and verbalized understanding. 

## 2017-01-24 ENCOUNTER — Other Ambulatory Visit (INDEPENDENT_AMBULATORY_CARE_PROVIDER_SITE_OTHER): Payer: Managed Care, Other (non HMO)

## 2017-01-24 ENCOUNTER — Encounter (INDEPENDENT_AMBULATORY_CARE_PROVIDER_SITE_OTHER): Payer: Self-pay

## 2017-01-24 DIAGNOSIS — E7800 Pure hypercholesterolemia, unspecified: Secondary | ICD-10-CM

## 2017-01-24 LAB — LIPID PANEL
CHOL/HDL RATIO: 3
Cholesterol: 154 mg/dL (ref 0–200)
HDL: 51.8 mg/dL (ref >39.00)
LDL CALC: 89 mg/dL (ref 0–99)
NonHDL: 101.83
TRIGLYCERIDES: 64 mg/dL (ref 0.0–149.0)
VLDL: 12.8 mg/dL (ref 0.0–40.0)

## 2017-01-24 LAB — COMPREHENSIVE METABOLIC PANEL
ALT: 24 U/L (ref 0–53)
AST: 25 U/L (ref 0–37)
Albumin: 4.5 g/dL (ref 3.5–5.2)
Alkaline Phosphatase: 49 U/L (ref 39–117)
BUN: 20 mg/dL (ref 6–23)
CALCIUM: 9.4 mg/dL (ref 8.4–10.5)
CHLORIDE: 104 meq/L (ref 96–112)
CO2: 32 meq/L (ref 19–32)
CREATININE: 1.07 mg/dL (ref 0.40–1.50)
GFR: 78.87 mL/min (ref >60.00)
Glucose, Bld: 100 mg/dL — ABNORMAL HIGH (ref 70–99)
Potassium: 4.4 mEq/L (ref 3.5–5.1)
Sodium: 140 mEq/L (ref 135–145)
Total Bilirubin: 0.5 mg/dL (ref 0.2–1.2)
Total Protein: 7 g/dL (ref 6.0–8.3)

## 2017-01-27 ENCOUNTER — Encounter: Payer: 59 | Admitting: Family Medicine

## 2017-01-30 ENCOUNTER — Encounter: Payer: Self-pay | Admitting: Family Medicine

## 2017-01-30 ENCOUNTER — Ambulatory Visit (INDEPENDENT_AMBULATORY_CARE_PROVIDER_SITE_OTHER): Payer: Managed Care, Other (non HMO) | Admitting: Family Medicine

## 2017-01-30 VITALS — BP 122/80 | HR 60 | Temp 98.4°F | Ht 73.0 in | Wt 231.5 lb

## 2017-01-30 DIAGNOSIS — Z0000 Encounter for general adult medical examination without abnormal findings: Secondary | ICD-10-CM | POA: Diagnosis not present

## 2017-01-30 DIAGNOSIS — I10 Essential (primary) hypertension: Secondary | ICD-10-CM

## 2017-01-30 DIAGNOSIS — E7800 Pure hypercholesterolemia, unspecified: Secondary | ICD-10-CM

## 2017-01-30 DIAGNOSIS — Z7189 Other specified counseling: Secondary | ICD-10-CM

## 2017-01-30 MED ORDER — ATORVASTATIN CALCIUM 10 MG PO TABS: 10.0000 mg | ORAL_TABLET | Freq: Every day | ORAL | 3 refills | Status: DC

## 2017-01-30 MED ORDER — VALACYCLOVIR HCL 1 G PO TABS: 2000.0000 mg | ORAL_TABLET | Freq: Two times a day (BID) | ORAL | 1 refills | Status: DC

## 2017-01-30 MED ORDER — AMLODIPINE BESYLATE 5 MG PO TABS: 5.0000 mg | ORAL_TABLET | Freq: Every day | ORAL | 3 refills | Status: DC

## 2017-01-30 NOTE — Progress Notes (Signed)
Pre visit review using our clinic review tool, if applicable. No additional management support is needed unless otherwise documented below in the visit note. 

## 2017-01-30 NOTE — Progress Notes (Signed)
CPE- See plan.  Routine anticipatory guidance given to patient.  See health maintenance.  The possibility exists that previously documented standard health maintenance information may have been brought forward from a previous encounter into this note.  If needed, that same information has been updated to reflect the current situation based on today's encounter.    Tetanus 2011 Flu shot encouraged for fall 2018 PSA not due yet. D/w pt.  Colon cancer screening not due. d/w pt.  Diet and exercise d/w pt. running. Diet is "okay". He is working on both. Encouraged.  Living will d/w pt. Wife designated if incapacitated.  [facility]   He has had routine dermatology f/u.    Elevated Cholesterol: Using medications without problems: yes Muscle aches: no Diet compliance: encouraged, he has been eating a few more carbs but diet is still healthy in general.   Exercise: yes, running.  Labs d/w pt.    Hypertension:    Using medication without problems or lightheadedness: yes Chest pain with exertion:no Edema:no Short of breath:no  occ flare treated with valtrex.  No ADE on med.    PMH and SH reviewed  Meds, vitals, and allergies reviewed.   ROS: Per HPI.  Unless specifically indicated otherwise in HPI, the patient denies:  General: fever. Eyes: acute vision changes ENT: sore throat Cardiovascular: chest pain Respiratory: SOB GI: vomiting GU: dysuria Musculoskeletal: acute back pain Derm: acute rash Neuro: acute motor dysfunction Psych: worsening mood Endocrine: polydipsia Heme: bleeding Allergy: hayfever  GEN: nad, alert and oriented HEENT: mucous membranes moist NECK: supple w/o LA CV: rrr. PULM: ctab, no inc wob ABD: soft, +bs EXT: no edema

## 2017-01-30 NOTE — Assessment & Plan Note (Signed)
Controlled, continue as is.  Labs d/w pt.  

## 2017-01-30 NOTE — Assessment & Plan Note (Signed)
Controlled, continue as is.  D/w pt about diet and exercise.  

## 2017-01-30 NOTE — Patient Instructions (Signed)
Take care.  Glad to see you.  Update me as needed.  Don't change your meds for now except for the pill change on lipitor.

## 2017-01-30 NOTE — Assessment & Plan Note (Signed)
Tetanus 2011 Flu shot encouraged for fall 2018 PSA not due yet. D/w pt.  Colon cancer screening not due. d/w pt.  Diet and exercise d/w pt. running. Diet is "okay". He is working on both. Encouraged.  Living will d/w pt. Wife designated if incapacitated.  [facility]

## 2017-08-28 ENCOUNTER — Ambulatory Visit: Payer: Self-pay | Admitting: *Deleted

## 2017-08-28 NOTE — Telephone Encounter (Signed)
  Reason for Disposition . Mild rash from poison ivy, oak or sumac  Answer Assessment - Initial Assessment Questions 1. APPEARANCE of RASH: "Describe the rash."      Red, blister- not fluid filled 2. LOCATION: "Where is the rash located?"      Bilateral arms, especially in the bends of the elbow 3. SIZE: "How large is the rash?"      Mid forearm up to mid bicep 4. ONSET: "When did the rash begin?"      yesterday 5. ITCHING: "Does the rash itch?" If so, ask: "How bad is it?"   - MILD - doesn't interfere with normal activities   - MODERATE-SEVERE: interferes with work, school, sleep, or other activities      moderate  Protocols used: POISON IVY - OAK - SUMAC-A-AH

## 2017-11-20 ENCOUNTER — Other Ambulatory Visit: Payer: Self-pay | Admitting: *Deleted

## 2017-11-20 NOTE — Telephone Encounter (Signed)
Faxed refill request.  Valtrex Last office visit:   01/30/17   Appointments scheduled in April 2019 Last Filled:    20 tablet 1 01/30/2017  Please advise.

## 2017-11-21 MED ORDER — VALACYCLOVIR HCL 1 G PO TABS: 2000.0000 mg | ORAL_TABLET | Freq: Two times a day (BID) | ORAL | 1 refills | Status: DC

## 2017-11-21 NOTE — Telephone Encounter (Signed)
Sent. Thanks.   

## 2018-01-29 ENCOUNTER — Other Ambulatory Visit (INDEPENDENT_AMBULATORY_CARE_PROVIDER_SITE_OTHER): Payer: Managed Care, Other (non HMO)

## 2018-01-29 ENCOUNTER — Other Ambulatory Visit: Payer: Self-pay | Admitting: Family Medicine

## 2018-01-29 DIAGNOSIS — I10 Essential (primary) hypertension: Secondary | ICD-10-CM

## 2018-01-29 LAB — LIPID PANEL
Cholesterol: 149 mg/dL (ref 0–200)
HDL: 56 mg/dL (ref >39.00)
LDL CALC: 83 mg/dL (ref 0–99)
NonHDL: 93.15
TRIGLYCERIDES: 53 mg/dL (ref 0.0–149.0)
Total CHOL/HDL Ratio: 3
VLDL: 10.6 mg/dL (ref 0.0–40.0)

## 2018-01-29 LAB — COMPREHENSIVE METABOLIC PANEL
ALBUMIN: 4 g/dL (ref 3.5–5.2)
ALT: 24 U/L (ref 0–53)
AST: 28 U/L (ref 0–37)
Alkaline Phosphatase: 42 U/L (ref 39–117)
BUN: 16 mg/dL (ref 6–23)
CALCIUM: 9 mg/dL (ref 8.4–10.5)
CHLORIDE: 105 meq/L (ref 96–112)
CO2: 27 mEq/L (ref 19–32)
CREATININE: 0.99 mg/dL (ref 0.40–1.50)
GFR: 85.89 mL/min (ref >60.00)
Glucose, Bld: 105 mg/dL — ABNORMAL HIGH (ref 70–99)
POTASSIUM: 3.8 meq/L (ref 3.5–5.1)
Sodium: 139 mEq/L (ref 135–145)
Total Bilirubin: 0.9 mg/dL (ref 0.2–1.2)
Total Protein: 6.6 g/dL (ref 6.0–8.3)

## 2018-02-01 ENCOUNTER — Encounter: Payer: Self-pay | Admitting: Family Medicine

## 2018-02-01 ENCOUNTER — Ambulatory Visit (INDEPENDENT_AMBULATORY_CARE_PROVIDER_SITE_OTHER): Payer: Managed Care, Other (non HMO) | Admitting: Family Medicine

## 2018-02-01 ENCOUNTER — Telehealth: Payer: Self-pay | Admitting: *Deleted

## 2018-02-01 VITALS — BP 120/76 | HR 64 | Temp 98.8°F | Ht 73.0 in | Wt 238.0 lb

## 2018-02-01 DIAGNOSIS — Z0000 Encounter for general adult medical examination without abnormal findings: Secondary | ICD-10-CM | POA: Diagnosis not present

## 2018-02-01 DIAGNOSIS — Z7189 Other specified counseling: Secondary | ICD-10-CM

## 2018-02-01 DIAGNOSIS — M25529 Pain in unspecified elbow: Secondary | ICD-10-CM

## 2018-02-01 DIAGNOSIS — I10 Essential (primary) hypertension: Secondary | ICD-10-CM

## 2018-02-01 DIAGNOSIS — R21 Rash and other nonspecific skin eruption: Secondary | ICD-10-CM

## 2018-02-01 DIAGNOSIS — B009 Herpesviral infection, unspecified: Secondary | ICD-10-CM

## 2018-02-01 DIAGNOSIS — E7800 Pure hypercholesterolemia, unspecified: Secondary | ICD-10-CM

## 2018-02-01 DIAGNOSIS — L237 Allergic contact dermatitis due to plants, except food: Secondary | ICD-10-CM

## 2018-02-01 MED ORDER — HYDROCORTISONE 2 % EX LOTN: TOPICAL_LOTION | CUTANEOUS | 12 refills | Status: DC

## 2018-02-01 MED ORDER — ATORVASTATIN CALCIUM 10 MG PO TABS: 10.0000 mg | ORAL_TABLET | Freq: Every day | ORAL | 3 refills | Status: DC

## 2018-02-01 MED ORDER — VALACYCLOVIR HCL 1 G PO TABS: 2000.0000 mg | ORAL_TABLET | Freq: Two times a day (BID) | ORAL | 1 refills | Status: DC

## 2018-02-01 MED ORDER — AMLODIPINE BESYLATE 5 MG PO TABS: 5.0000 mg | ORAL_TABLET | Freq: Every day | ORAL | 3 refills | Status: DC

## 2018-02-01 MED ORDER — PREDNISONE 20 MG PO TABS: ORAL_TABLET | ORAL | 0 refills | Status: DC

## 2018-02-01 NOTE — Patient Instructions (Addendum)
Try a tennis elbow strap and see if that helps.  Icing is reasonable.  Limit the weight lifting for now.    Use the prednisone only if you have a significant flare with poison ivy.  Take prednisone with food.   Update me as needed.  Take care.  Thanks for your effort.  Glad to see you.

## 2018-02-01 NOTE — Telephone Encounter (Signed)
Alternative Requested:  Scalacort 2% Lotion.  Drug has been discontinued.  Please advise.

## 2018-02-01 NOTE — Progress Notes (Signed)
CPE- See plan.  Routine anticipatory guidance given to patient.  See health maintenance.  The possibility exists that previously documented standard health maintenance information may have been brought forward from a previous encounter into this note.  If needed, that same information has been updated to reflect the current situation based on today's encounter.    Tetanus 2011 Flu shot encouraged for fall 2019 PSA not due yet. D/w pt.   Colon cancer screening not due. d/w pt.  Diet and exercise d/w pt.  He ran a half marathon on a treadmill recently.  Weight back up when he got back on more carbs.   Living will d/w pt. Wife designated if patient were incapacitated.  [facility]    He has sig reaction to poison ivy.  D/w pt about prednisone use if needed.   rx given to patient to hold.    He has B elbow pain.  He tried icing.  He is cutting back some on weight with lifting.    H/o fever blisters better with valtrex use, used prn.   Hypertension:    Using medication without problems or lightheadedness: yes Chest pain with exertion:no Edema:no Short of breath:no  Elevated Cholesterol: Using medications without problems: yes Muscle aches: no Diet compliance:yes Exercise:yes Labs d/w pt.    occ rash on the chest resolved with hydrocortisone.   D/w pt about options.  Med refilled.    PMH and SH reviewed  Meds, vitals, and allergies reviewed.   ROS: Per HPI.  Unless specifically indicated otherwise in HPI, the patient denies:  General: fever. Eyes: acute vision changes ENT: sore throat Cardiovascular: chest pain Respiratory: SOB GI: vomiting GU: dysuria Musculoskeletal: acute back pain Derm: acute rash Neuro: acute motor dysfunction Psych: worsening mood Endocrine: polydipsia Heme: bleeding Allergy: hayfever  GEN: nad, alert and oriented HEENT: mucous membranes moist NECK: supple w/o LA CV: rrr. PULM: ctab, no inc wob ABD: soft, +bs EXT: no  edema SKIN: no acute rash R medial elbow pain better with compression of the forearm c/w golfer's elbow.

## 2018-02-02 MED ORDER — TRIAMCINOLONE ACETONIDE 0.1 % EX LOTN: 1.0000 "application " | TOPICAL_LOTION | Freq: Every day | CUTANEOUS | 3 refills | Status: DC | PRN

## 2018-02-02 NOTE — Addendum Note (Signed)
Addended by: Joaquim NamUNCAN, Derrica Sieg S on: 02/02/2018 06:28 AM   Modules accepted: Orders

## 2018-02-02 NOTE — Telephone Encounter (Addendum)
Would change to TAC.  rx sent.  Use sparingly.  Thanks.

## 2018-02-03 DIAGNOSIS — M25529 Pain in unspecified elbow: Secondary | ICD-10-CM | POA: Insufficient documentation

## 2018-02-03 DIAGNOSIS — L237 Allergic contact dermatitis due to plants, except food: Secondary | ICD-10-CM | POA: Insufficient documentation

## 2018-02-03 DIAGNOSIS — R21 Rash and other nonspecific skin eruption: Secondary | ICD-10-CM | POA: Insufficient documentation

## 2018-02-03 NOTE — Assessment & Plan Note (Signed)
Controlled.  No change in meds.  Continue work on diet and exercise.  He agrees. 

## 2018-02-03 NOTE — Assessment & Plan Note (Signed)
Living will d/w pt.  Wife designated if patient were incapacitated.   ?

## 2018-02-03 NOTE — Assessment & Plan Note (Signed)
He can try a tennis elbow strap and see if that helps.  Icing is reasonable.  Limit the weight lifting for now.    update me as needed.  Anatomy discussed with patient.  He agrees with plan.

## 2018-02-03 NOTE — Assessment & Plan Note (Signed)
Tetanus 2011 Flu shot encouraged for fall 2019 PSA not due yet. D/w pt.   Colon cancer screening not due. d/w pt.  Diet and exercise d/w pt.  He ran a half marathon on a treadmill recently.  Weight back up when he got back on more carbs.   Living will d/w pt. Wife designated if patient were incapacitated.  [facility]

## 2018-02-03 NOTE — Assessment & Plan Note (Signed)
No symptoms now.  He can use a topical steroid as needed.  Discussed with patient about using it on a limited basis.

## 2018-02-03 NOTE — Assessment & Plan Note (Signed)
No symptoms now.  History of significant reaction in the past.  I gave him a prescription for prednisone to hold.  Use as needed with routine steroid cautions.  He agrees.

## 2018-02-03 NOTE — Assessment & Plan Note (Signed)
Use Valtrex as needed.  Update me as needed. No sx currently.

## 2019-01-22 ENCOUNTER — Other Ambulatory Visit: Payer: Self-pay | Admitting: Family Medicine

## 2019-01-22 DIAGNOSIS — I10 Essential (primary) hypertension: Secondary | ICD-10-CM

## 2019-01-25 ENCOUNTER — Other Ambulatory Visit: Payer: Managed Care, Other (non HMO)

## 2019-01-31 ENCOUNTER — Other Ambulatory Visit: Payer: Managed Care, Other (non HMO)

## 2019-02-05 ENCOUNTER — Encounter: Payer: Managed Care, Other (non HMO) | Admitting: Family Medicine

## 2019-04-03 ENCOUNTER — Other Ambulatory Visit (INDEPENDENT_AMBULATORY_CARE_PROVIDER_SITE_OTHER): Payer: Managed Care, Other (non HMO)

## 2019-04-03 ENCOUNTER — Other Ambulatory Visit: Payer: Managed Care, Other (non HMO)

## 2019-04-03 DIAGNOSIS — I10 Essential (primary) hypertension: Secondary | ICD-10-CM | POA: Diagnosis not present

## 2019-04-03 LAB — COMPREHENSIVE METABOLIC PANEL
ALT: 27 U/L (ref 0–53)
AST: 30 U/L (ref 0–37)
Albumin: 4.3 g/dL (ref 3.5–5.2)
Alkaline Phosphatase: 46 U/L (ref 39–117)
BUN: 14 mg/dL (ref 6–23)
CO2: 28 mEq/L (ref 19–32)
Calcium: 9.1 mg/dL (ref 8.4–10.5)
Chloride: 103 mEq/L (ref 96–112)
Creatinine, Ser: 0.96 mg/dL (ref 0.40–1.50)
GFR: 83.32 mL/min (ref >60.00)
Glucose, Bld: 98 mg/dL (ref 70–99)
Potassium: 4.3 mEq/L (ref 3.5–5.1)
Sodium: 137 mEq/L (ref 135–145)
Total Bilirubin: 0.6 mg/dL (ref 0.2–1.2)
Total Protein: 6.6 g/dL (ref 6.0–8.3)

## 2019-04-03 LAB — LIPID PANEL
Cholesterol: 192 mg/dL (ref 0–200)
HDL: 58.7 mg/dL (ref >39.00)
LDL Cholesterol: 122 mg/dL — ABNORMAL HIGH (ref 0–99)
NonHDL: 132.99
Total CHOL/HDL Ratio: 3
Triglycerides: 53 mg/dL (ref 0.0–149.0)
VLDL: 10.6 mg/dL (ref 0.0–40.0)

## 2019-04-08 ENCOUNTER — Ambulatory Visit (INDEPENDENT_AMBULATORY_CARE_PROVIDER_SITE_OTHER): Payer: Managed Care, Other (non HMO) | Admitting: Family Medicine

## 2019-04-08 ENCOUNTER — Encounter: Payer: Self-pay | Admitting: Family Medicine

## 2019-04-08 VITALS — HR 74 | Ht 73.0 in | Wt 228.0 lb

## 2019-04-08 DIAGNOSIS — Z0000 Encounter for general adult medical examination without abnormal findings: Secondary | ICD-10-CM | POA: Diagnosis not present

## 2019-04-08 DIAGNOSIS — I10 Essential (primary) hypertension: Secondary | ICD-10-CM

## 2019-04-08 DIAGNOSIS — Z7189 Other specified counseling: Secondary | ICD-10-CM

## 2019-04-08 DIAGNOSIS — E7800 Pure hypercholesterolemia, unspecified: Secondary | ICD-10-CM

## 2019-04-08 DIAGNOSIS — B009 Herpesviral infection, unspecified: Secondary | ICD-10-CM

## 2019-04-08 MED ORDER — ATORVASTATIN CALCIUM 10 MG PO TABS: 10.0000 mg | ORAL_TABLET | Freq: Every day | ORAL | 3 refills | Status: DC

## 2019-04-08 MED ORDER — AMLODIPINE BESYLATE 5 MG PO TABS: 5.0000 mg | ORAL_TABLET | Freq: Every day | ORAL | 3 refills | Status: DC

## 2019-04-08 MED ORDER — VALACYCLOVIR HCL 1 G PO TABS: 2000.0000 mg | ORAL_TABLET | Freq: Two times a day (BID) | ORAL | 3 refills | Status: DC

## 2019-04-08 NOTE — Progress Notes (Signed)
Interactive audio and video telecommunications were attempted between this provider and patient, however failed, due to patient having technical difficulties OR patient did not have access to video capability.  We continued and completed visit with audio only.   Virtual Visit via Telephone Note  I connected with patient on 04/08/19 at 4:04 PM by telephone and verified that I am speaking with the correct person using two identifiers.  Location of patient: home  Location of MD: [place] Name of referring provider (if blank then none associated): Names per persons and role in encounter:  MD: Earlyne Iba, Patient: name listed above.    I discussed the limitations, risks, security and privacy concerns of performing an evaluation and management service by telephone and the availability of in person appointments. I also discussed with the patient that there may be a patient responsible charge related to this service. The patient expressed understanding and agreed to proceed.  CC:  F/u   Tetanus 2011 Flu shot encouraged for fall 2019 PNA and shingles not due.   PSA not due yet. D/w pt.   Colon cancer screening not due. d/w pt.  Diet and exercise d/w pt.   Living will d/w pt. Wife designated if patient were incapacitated.  [facility]   Hypertension:    Using medication without problems or lightheadedness: yes Chest pain with exertion:no Edema: rarely noted, with high salt foods and when dependent Short of breath:no Labs d/w pt  Elevated Cholesterol: Using medications without problems:yes Muscle aches: no Diet compliance:yes Exercise:yes Lipids up from prev.  D/w pt.   Occ oral HSV flare, treated prn with valtrex.    Observations/Objective: nad Speech wnl  Assessment and Plan:  Tetanus 2011 Flu shot encouraged for fall 2019 PNA and shingles not due.   PSA not due yet. D/w pt.   Colon cancer screening not due. d/w pt.  Diet and exercise d/w  pt.   Living will d/w pt. Wife designated if patient were incapacitated.  [facility]   Hypertension:    No change in meds.  Continue work on diet and exercise.  Elevated Cholesterol: No change in meds. Lipids up from prev.  D/w pt.  Continue work on diet and exercise and recheck labs in about 6 months.  Occ oral HSV flare, treated prn with valtrex.  Continue as needed use.  Follow Up Instructions: see above.     I discussed the assessment and treatment plan with the patient. The patient was provided an opportunity to ask questions and all were answered. The patient agreed with the plan and demonstrated an understanding of the instructions.   The patient was advised to call back or seek an in-person evaluation if the symptoms worsen or if the condition fails to improve as anticipated.  I provided 20 minutes of non-face-to-face time during this encounter.  Elsie Stain, MD

## 2019-04-10 NOTE — Assessment & Plan Note (Signed)
Continue Valtrex for as needed flares.

## 2019-04-10 NOTE — Assessment & Plan Note (Signed)
No change in meds.  Continue work on diet and exercise.  Can recheck lipids in about 6 months.

## 2019-04-10 NOTE — Assessment & Plan Note (Signed)
Living will d/w pt.  Wife designated if patient were incapacitated.   ?

## 2019-04-10 NOTE — Assessment & Plan Note (Signed)
No change in meds.  Continue work on diet and exercise. 

## 2019-04-10 NOTE — Assessment & Plan Note (Signed)
Tetanus 2011 Flu shot encouraged for fall 2019 PNA and shingles not due.   PSA not due yet. D/w pt.   Colon cancer screening not due. d/w pt.  Diet and exercise d/w pt.   Living will d/w pt. Wife designated if patient were incapacitated.  [facility]

## 2019-04-18 ENCOUNTER — Ambulatory Visit: Payer: Self-pay

## 2019-04-18 NOTE — Telephone Encounter (Signed)
Patient's wife called and asked about testing for covid exposure. I advised I will need to speak to the patient, since she's not on his DPR. She says he's on a work call and she will have him to call back. She asked will he need a doctor's referral for the drive up testing sites, I advised with [facility] testing sites, he will need a referral. She says she will let him know to call back.

## 2019-04-19 ENCOUNTER — Ambulatory Visit (INDEPENDENT_AMBULATORY_CARE_PROVIDER_SITE_OTHER): Payer: Managed Care, Other (non HMO) | Admitting: Family Medicine

## 2019-04-19 ENCOUNTER — Telehealth: Payer: Self-pay | Admitting: *Deleted

## 2019-04-19 ENCOUNTER — Encounter: Payer: Self-pay | Admitting: Family Medicine

## 2019-04-19 ENCOUNTER — Other Ambulatory Visit: Payer: Self-pay

## 2019-04-19 ENCOUNTER — Telehealth: Payer: Self-pay

## 2019-04-19 DIAGNOSIS — Z20822 Contact with and (suspected) exposure to covid-19: Secondary | ICD-10-CM

## 2019-04-19 DIAGNOSIS — Z20828 Contact with and (suspected) exposure to other viral communicable diseases: Secondary | ICD-10-CM

## 2019-04-19 NOTE — Assessment & Plan Note (Signed)
Home isolation until test results return. Works from home.  Ordered testing.  Self care and ER precautions given.

## 2019-04-19 NOTE — Telephone Encounter (Signed)
-----   Message from Jinny Sanders, MD sent at 04/19/2019  4:51 PM EDT ----- Nicholas Vasquez [birth year] [phone] Re: Exposure to covid positive niece < 6 feet, no mask.  Pt is currently asymptomatic. [employer] managed [id number]

## 2019-04-19 NOTE — Telephone Encounter (Signed)
Pt called and scheduled for appt at [facility] location on 04/22/19. Pt advised to wear a mask at appt and to remain in the car. Pt verbalized understanding.

## 2019-04-19 NOTE — Telephone Encounter (Signed)
[place] Night - Client Nonclinical Telephone Record AccessNurse Client [name] Night - Client Client Site [place] - Night Physician [facility] - PHYSICIAN, NOT LISTED- MD Contact Type Call Who Is Calling Patient / Member / Family / Caregiver Caller Name [name] Phone Number [phone], [phone] Patient Name Nicholas Vasquez Patient DOB [birth year] Call Type Message Only Information Provided Reason for Call Request for General Office Information Initial Comment Caller states the family member has COVID and they were around them, no sx, and they want a test. Additional Comment PCP Dr. Elsie Stain. Call Closed By: [place] Date/

## 2019-04-19 NOTE — Progress Notes (Signed)
VIRTUAL VISIT Due to national recommendations of social distancing due to [place] 19, a virtual visit is felt to be most appropriate for this patient at this time.   I connected with the patient on 04/19/19 at  4:20 PM EDT by virtual telehealth platform and verified that I am speaking with the correct person using two identifiers.   I discussed the limitations, risks, security and privacy concerns of performing an evaluation and management service by  virtual telehealth platform and the availability of in person appointments. I also discussed with the patient that there may be a patient responsible charge related to this service. The patient expressed understanding and agreed to proceed.  Patient location: Home Provider Location: [facility] [place] Participants: Eliezer Lofts and [place]   Chief Complaint  Patient presents with  . Covid Exposure    Niece tested positive-Was in contact with Niece on Saturday    History of Present Illness: 48 year old male pt of Dr. Josefine Class COVID risk level 2 presents with  Exposure to COVID positive individual in close contact.   He reports 1 week ago he was in close contact with his niece. She tested positive on June 15 .. she has been asymptomatic. No masks, and spent > 1 hour in  < 6 feet.  He currently has no SOB, no fever, no congestion, no  ST, no emesis, no diarrhea, no loss of taste and smell  He is working from home.  COVID 19 screen No recent travel or known exposure to COVID19 The patient denies respiratory symptoms of COVID 19 at this time.  The importance of social distancing was discussed today.   Review of Systems  Constitutional: Negative for chills and fever.  HENT: Negative for congestion and ear pain.   Eyes: Negative for pain and redness.  Respiratory: Negative for cough and shortness of breath.   Cardiovascular: Negative for chest pain, palpitations and leg swelling.  Gastrointestinal: Negative for abdominal pain, blood  in stool, constipation, diarrhea, nausea and vomiting.  Genitourinary: Negative for dysuria.  Musculoskeletal: Negative for falls and myalgias.  Skin: Negative for rash.  Neurological: Negative for dizziness.  Psychiatric/Behavioral: Negative for depression. The patient is not nervous/anxious.       Past Medical History:  Diagnosis Date  . History of SCC (squamous cell carcinoma) of skin   . HSV infection    oral- episodic, tx as needed   . Hyperlipidemia 12-2001  . Hypertension 05-1997    reports that he has never smoked. He has never used smokeless tobacco. He reports current alcohol use. He reports that he does not use drugs.   Current Outpatient Medications:  .  amLODipine (NORVASC) 5 MG tablet, Take 1 tablet (5 mg total) by mouth daily., Disp: 90 tablet, Rfl: 3 .  atorvastatin (LIPITOR) 10 MG tablet, Take 1 tablet (10 mg total) by mouth daily., Disp: 90 tablet, Rfl: 3 .  valACYclovir (VALTREX) 1000 MG tablet, Take 2 tablets (2,000 mg total) by mouth 2 (two) times daily. For 1 day per episode, Disp: 20 tablet, Rfl: 3   Observations/Objective: Pulse 78, height 6\' 1"  (1.854 m), weight 227 lb (103 kg).  Physical Exam  Physical Exam Constitutional:      General: The patient is not in acute distress. Pulmonary:     Effort: Pulmonary effort is normal. No respiratory distress.  Neurological:     Mental Status: The patient is alert and oriented to person, place, and time.  Psychiatric:  Mood and Affect: Mood normal.        Behavior: Behavior normal.   Assessment and Plan   Exposure to Covid-19 Virus  Home isolation until test results return. Works from home.  Ordered testing.  Self care and ER precautions given.    I discussed the assessment and treatment plan with the patient. The patient was provided an opportunity to ask questions and all were answered. The patient agreed with the plan and demonstrated an understanding of the instructions.   The patient was advised  to call back or seek an in-person evaluation if the symptoms worsen or if the condition fails to improve as anticipated.     Kerby NoraAmy Wilfrid Hyser, MD

## 2019-04-19 NOTE — Telephone Encounter (Signed)
Pt said pts niece tested positive for covid and pt was with her on 04/14/19 but niece was asymptomatic. pts niece was in a study and that was why she was tested. Pt has no covid symptoms and no travel. Pt is self quarantining but request covid testing. FYI to Dr Diona Browner. Pt scheduled virtual appt today at 4:20 with Dr Diona Browner.

## 2019-04-22 ENCOUNTER — Other Ambulatory Visit: Payer: Managed Care, Other (non HMO)

## 2019-04-22 DIAGNOSIS — Z20822 Contact with and (suspected) exposure to covid-19: Secondary | ICD-10-CM

## 2019-04-26 LAB — NOVEL CORONAVIRUS, NAA: SARS-CoV-2, NAA: NOT DETECTED

## 2019-10-09 ENCOUNTER — Other Ambulatory Visit: Payer: Self-pay

## 2019-10-09 DIAGNOSIS — Z20822 Contact with and (suspected) exposure to covid-19: Secondary | ICD-10-CM

## 2019-10-10 ENCOUNTER — Other Ambulatory Visit: Payer: Self-pay

## 2019-10-10 ENCOUNTER — Other Ambulatory Visit (INDEPENDENT_AMBULATORY_CARE_PROVIDER_SITE_OTHER): Payer: Managed Care, Other (non HMO)

## 2019-10-10 DIAGNOSIS — E7800 Pure hypercholesterolemia, unspecified: Secondary | ICD-10-CM | POA: Diagnosis not present

## 2019-10-10 LAB — LIPID PANEL
Cholesterol: 168 mg/dL (ref 0–200)
HDL: 57.8 mg/dL (ref >39.00)
LDL Cholesterol: 97 mg/dL (ref 0–99)
NonHDL: 109.71
Total CHOL/HDL Ratio: 3
Triglycerides: 64 mg/dL (ref 0.0–149.0)
VLDL: 12.8 mg/dL (ref 0.0–40.0)

## 2019-10-11 LAB — NOVEL CORONAVIRUS, NAA: SARS-CoV-2, NAA: NOT DETECTED

## 2019-11-19 ENCOUNTER — Other Ambulatory Visit: Payer: Managed Care, Other (non HMO)

## 2019-11-19 ENCOUNTER — Ambulatory Visit: Payer: Managed Care, Other (non HMO) | Attending: Internal Medicine

## 2019-11-19 DIAGNOSIS — Z20822 Contact with and (suspected) exposure to covid-19: Secondary | ICD-10-CM

## 2019-11-21 LAB — NOVEL CORONAVIRUS, NAA: SARS-CoV-2, NAA: NOT DETECTED

## 2020-03-03 ENCOUNTER — Encounter: Payer: Self-pay | Admitting: Family Medicine

## 2020-03-31 ENCOUNTER — Other Ambulatory Visit: Payer: Self-pay | Admitting: Family Medicine

## 2020-03-31 DIAGNOSIS — Z125 Encounter for screening for malignant neoplasm of prostate: Secondary | ICD-10-CM

## 2020-03-31 DIAGNOSIS — I10 Essential (primary) hypertension: Secondary | ICD-10-CM

## 2020-04-06 ENCOUNTER — Other Ambulatory Visit (INDEPENDENT_AMBULATORY_CARE_PROVIDER_SITE_OTHER): Payer: Managed Care, Other (non HMO)

## 2020-04-06 DIAGNOSIS — Z125 Encounter for screening for malignant neoplasm of prostate: Secondary | ICD-10-CM | POA: Diagnosis not present

## 2020-04-06 DIAGNOSIS — I10 Essential (primary) hypertension: Secondary | ICD-10-CM | POA: Diagnosis not present

## 2020-04-06 LAB — COMPREHENSIVE METABOLIC PANEL
ALT: 23 U/L (ref 0–53)
AST: 27 U/L (ref 0–37)
Albumin: 4.4 g/dL (ref 3.5–5.2)
Alkaline Phosphatase: 46 U/L (ref 39–117)
BUN: 18 mg/dL (ref 6–23)
CO2: 27 mEq/L (ref 19–32)
Calcium: 9 mg/dL (ref 8.4–10.5)
Chloride: 106 mEq/L (ref 96–112)
Creatinine, Ser: 0.99 mg/dL (ref 0.40–1.50)
GFR: 80.08 mL/min (ref >60.00)
Glucose, Bld: 105 mg/dL — ABNORMAL HIGH (ref 70–99)
Potassium: 4.1 mEq/L (ref 3.5–5.1)
Sodium: 140 mEq/L (ref 135–145)
Total Bilirubin: 0.8 mg/dL (ref 0.2–1.2)
Total Protein: 6.5 g/dL (ref 6.0–8.3)

## 2020-04-06 LAB — LIPID PANEL
Cholesterol: 180 mg/dL (ref 0–200)
HDL: 58 mg/dL (ref >39.00)
LDL Cholesterol: 104 mg/dL — ABNORMAL HIGH (ref 0–99)
NonHDL: 121.53
Total CHOL/HDL Ratio: 3
Triglycerides: 86 mg/dL (ref 0.0–149.0)
VLDL: 17.2 mg/dL (ref 0.0–40.0)

## 2020-04-06 LAB — PSA: PSA: 1.03 ng/mL (ref 0.10–4.00)

## 2020-04-10 ENCOUNTER — Other Ambulatory Visit: Payer: Self-pay

## 2020-04-10 ENCOUNTER — Ambulatory Visit (INDEPENDENT_AMBULATORY_CARE_PROVIDER_SITE_OTHER): Payer: Managed Care, Other (non HMO) | Admitting: Family Medicine

## 2020-04-10 ENCOUNTER — Encounter: Payer: Self-pay | Admitting: Family Medicine

## 2020-04-10 VITALS — BP 134/82 | HR 58 | Temp 97.5°F | Ht 73.0 in | Wt 240.4 lb

## 2020-04-10 DIAGNOSIS — Z1211 Encounter for screening for malignant neoplasm of colon: Secondary | ICD-10-CM

## 2020-04-10 DIAGNOSIS — Z7189 Other specified counseling: Secondary | ICD-10-CM

## 2020-04-10 DIAGNOSIS — B009 Herpesviral infection, unspecified: Secondary | ICD-10-CM

## 2020-04-10 DIAGNOSIS — I10 Essential (primary) hypertension: Secondary | ICD-10-CM

## 2020-04-10 DIAGNOSIS — Z0000 Encounter for general adult medical examination without abnormal findings: Secondary | ICD-10-CM

## 2020-04-10 DIAGNOSIS — E7800 Pure hypercholesterolemia, unspecified: Secondary | ICD-10-CM

## 2020-04-10 MED ORDER — AMLODIPINE BESYLATE 5 MG PO TABS: 5.0000 mg | ORAL_TABLET | Freq: Every day | ORAL | 3 refills | Status: DC

## 2020-04-10 MED ORDER — VALACYCLOVIR HCL 1 G PO TABS: 2000.0000 mg | ORAL_TABLET | Freq: Two times a day (BID) | ORAL | 3 refills | Status: DC

## 2020-04-10 MED ORDER — ATORVASTATIN CALCIUM 10 MG PO TABS: 10.0000 mg | ORAL_TABLET | Freq: Every day | ORAL | 3 refills | Status: DC

## 2020-04-10 NOTE — Progress Notes (Signed)
This visit occurred during the SARS-CoV-2 public health emergency.  Safety protocols were in place, including screening questions prior to the visit, additional usage of staff PPE, and extensive cleaning of exam room while observing appropriate contact time as indicated for disinfecting solutions.  CPE- See plan.  Routine anticipatory guidance given to patient.  See health maintenance.  The possibility exists that previously documented standard health maintenance information may have been brought forward from a previous encounter into this note.  If needed, that same information has been updated to reflect the current situation based on today's encounter.    covid vaccine 2021 Tetanus 2011, can repeat later on given recent covid vaccine 2021 Flu shot encouraged for fall PNA and shingles not due.   PSA wnl 2021 D/w patient GX:[id number] for colon cancer screening, including IFOB vs. colonoscopy.  Risks and benefits of both were discussed and patient voiced understanding.  Pt elects for: cologuard Diet and exercise d/w pt.  Living will d/w pt. Wife designated if patient were incapacitated.  [facility]  Hypertension:    Using medication without problems or lightheadedness: yes Chest pain with exertion:no Edema:no Short of breath:no On amlodipine.    Valtrex used prn w/o ADE.    Elevated Cholesterol: Using medications without problems: yes, still on lipitor Muscle aches: some aches, likely keyboard related aches (with computer work) and not from statin.  Ergonomics d/w pt.   Diet compliance: yes Exercise:yes Labs d/w pt.   Older daughter attending [employer] fall 2021.  Discussed.  PMH and SH reviewed  Meds, vitals, and allergies reviewed.   ROS: Per HPI.  Unless specifically indicated otherwise in HPI, the patient denies:  General: fever. Eyes: acute vision changes ENT: sore throat Cardiovascular: chest pain Respiratory: SOB GI: vomiting GU:  dysuria Musculoskeletal: acute back pain Derm: acute rash Neuro: acute motor dysfunction Psych: worsening mood Endocrine: polydipsia Heme: bleeding Allergy: hayfever  GEN: nad, alert and oriented HEENT: ncat NECK: supple w/o LA CV: rrr. PULM: ctab, no inc wob ABD: soft, +bs EXT: no edema SKIN: no acute rash

## 2020-04-10 NOTE — Patient Instructions (Signed)
Thanks for your effort.  Don't change your meds for now.  Keep working on diet and exercise.  Take care.  Glad to see you.

## 2020-04-12 NOTE — Assessment & Plan Note (Signed)
Continue as needed Valtrex.  Update me as needed. 

## 2020-04-12 NOTE — Assessment & Plan Note (Signed)
Living will d/w pt.  Wife designated if patient were incapacitated.   ?

## 2020-04-12 NOTE — Assessment & Plan Note (Signed)
covid vaccine 2021 Tetanus 2011, can repeat later on given recent covid vaccine 2021 Flu shot encouraged for fall PNA and shingles not due.   PSA wnl 2021 D/w patient JP:[id number] for colon cancer screening, including IFOB vs. colonoscopy.  Risks and benefits of both were discussed and patient voiced understanding.  Pt elects for: cologuard.  We can order this when he turns 49. Diet and exercise d/w pt.  Living will d/w pt. Wife designated if patient were incapacitated.  [facility]

## 2020-04-12 NOTE — Assessment & Plan Note (Signed)
Continue amlodipine.  Continue work on diet and exercise.  Labs discussed with patient. 

## 2020-04-12 NOTE — Assessment & Plan Note (Signed)
Continue atorvastatin.  Continue work on diet and exercise.  Labs discussed with patient.  He agrees to plan.

## 2020-06-16 NOTE — Addendum Note (Signed)
Addended by: Annamarie Major on: 06/16/2020 08:24 AM   Modules accepted: Orders

## 2020-06-27 LAB — COLOGUARD
COLOGUARD: NEGATIVE
Cologuard: NEGATIVE
Cologuard: NEGATIVE

## 2020-07-20 ENCOUNTER — Encounter: Payer: Self-pay | Admitting: Family Medicine

## 2021-04-08 ENCOUNTER — Other Ambulatory Visit: Payer: Self-pay

## 2021-04-08 ENCOUNTER — Other Ambulatory Visit (INDEPENDENT_AMBULATORY_CARE_PROVIDER_SITE_OTHER): Payer: Managed Care, Other (non HMO)

## 2021-04-08 ENCOUNTER — Other Ambulatory Visit: Payer: Self-pay | Admitting: Family Medicine

## 2021-04-08 DIAGNOSIS — Z125 Encounter for screening for malignant neoplasm of prostate: Secondary | ICD-10-CM | POA: Diagnosis not present

## 2021-04-08 DIAGNOSIS — I10 Essential (primary) hypertension: Secondary | ICD-10-CM

## 2021-04-08 LAB — LIPID PANEL
Cholesterol: 197 mg/dL (ref 0–200)
HDL: 61.7 mg/dL (ref >39.00)
LDL Cholesterol: 121 mg/dL — ABNORMAL HIGH (ref 0–99)
NonHDL: 134.87
Total CHOL/HDL Ratio: 3
Triglycerides: 71 mg/dL (ref 0.0–149.0)
VLDL: 14.2 mg/dL (ref 0.0–40.0)

## 2021-04-08 LAB — COMPREHENSIVE METABOLIC PANEL
ALT: 26 U/L (ref 0–53)
AST: 26 U/L (ref 0–37)
Albumin: 4.7 g/dL (ref 3.5–5.2)
Alkaline Phosphatase: 49 U/L (ref 39–117)
BUN: 18 mg/dL (ref 6–23)
CO2: 28 mEq/L (ref 19–32)
Calcium: 9.6 mg/dL (ref 8.4–10.5)
Chloride: 102 mEq/L (ref 96–112)
Creatinine, Ser: 1.02 mg/dL (ref 0.40–1.50)
GFR: 85.51 mL/min (ref >60.00)
Glucose, Bld: 104 mg/dL — ABNORMAL HIGH (ref 70–99)
Potassium: 4.4 mEq/L (ref 3.5–5.1)
Sodium: 138 mEq/L (ref 135–145)
Total Bilirubin: 0.9 mg/dL (ref 0.2–1.2)
Total Protein: 7.3 g/dL (ref 6.0–8.3)

## 2021-04-08 LAB — PSA: PSA: 1.08 ng/mL (ref 0.10–4.00)

## 2021-04-15 ENCOUNTER — Encounter: Payer: Self-pay | Admitting: Family Medicine

## 2021-04-15 ENCOUNTER — Other Ambulatory Visit: Payer: Self-pay

## 2021-04-15 ENCOUNTER — Ambulatory Visit (INDEPENDENT_AMBULATORY_CARE_PROVIDER_SITE_OTHER): Payer: Managed Care, Other (non HMO) | Admitting: Family Medicine

## 2021-04-15 VITALS — BP 140/80 | HR 66 | Temp 98.0°F | Ht 72.75 in | Wt 240.1 lb

## 2021-04-15 DIAGNOSIS — E7800 Pure hypercholesterolemia, unspecified: Secondary | ICD-10-CM

## 2021-04-15 DIAGNOSIS — I10 Essential (primary) hypertension: Secondary | ICD-10-CM

## 2021-04-15 DIAGNOSIS — Z0000 Encounter for general adult medical examination without abnormal findings: Secondary | ICD-10-CM | POA: Diagnosis not present

## 2021-04-15 DIAGNOSIS — Z23 Encounter for immunization: Secondary | ICD-10-CM

## 2021-04-15 DIAGNOSIS — Z8249 Family history of ischemic heart disease and other diseases of the circulatory system: Secondary | ICD-10-CM

## 2021-04-15 DIAGNOSIS — Z7189 Other specified counseling: Secondary | ICD-10-CM

## 2021-04-15 LAB — HM HEPATITIS C SCREENING LAB: HM Hepatitis Screen: NEGATIVE

## 2021-04-15 MED ORDER — AMLODIPINE BESYLATE 5 MG PO TABS: 5.0000 mg | ORAL_TABLET | Freq: Every day | ORAL | 3 refills | Status: DC

## 2021-04-15 MED ORDER — VALACYCLOVIR HCL 1 G PO TABS: 2000.0000 mg | ORAL_TABLET | Freq: Two times a day (BID) | ORAL | 3 refills | Status: DC

## 2021-04-15 MED ORDER — ATORVASTATIN CALCIUM 10 MG PO TABS: 10.0000 mg | ORAL_TABLET | Freq: Every day | ORAL | 3 refills | Status: DC

## 2021-04-15 NOTE — Progress Notes (Signed)
This visit occurred during the SARS-CoV-2 public health emergency.  Safety protocols were in place, including screening questions prior to the visit, additional usage of staff PPE, and extensive cleaning of exam room while observing appropriate contact time as indicated for disinfecting solutions.  CPE- See plan.  Routine anticipatory guidance given to patient.  See health maintenance.  The possibility exists that previously documented standard health maintenance information may have been brought forward from a previous encounter into this note.  If needed, that same information has been updated to reflect the current situation based on today's encounter.    covid vaccine 2021 Tetanus 2022 Flu shot encouraged for fall PNA not due.  shingles d/w pt.    PSA wnl 2022 Cologuard neg 2021 Diet and exercise d/w pt.   Living will d/w pt. Wife designated if patient were incapacitated. [facility]    His mother is moving into [facility].   His wife's mother has breast cancer and needed a feeding tube.  Discussed.    Hypertension:    Using medication without problems or lightheadedness: yes Chest pain with exertion:no Edema:no Short of breath:no Average home BPs:  Elevated Cholesterol: Using medications without problems: yes Muscle aches:no Diet compliance: no Exercise: no  Discussed getting cardiac CT/calcium scoring set up given his family history.  He wanted to proceed and this is reasonable.  PMH and SH reviewed  Meds, vitals, and allergies reviewed.   ROS: Per HPI.  Unless specifically indicated otherwise in HPI, the patient denies:  General: fever. Eyes: acute vision changes ENT: sore throat Cardiovascular: chest pain Respiratory: SOB GI: vomiting GU: dysuria Musculoskeletal: acute back pain Derm: acute rash Neuro: acute motor dysfunction Psych: worsening mood Endocrine: polydipsia Heme: bleeding Allergy: hayfever  GEN: nad, alert and  oriented HEENT: ncat NECK: supple w/o LA CV: rrr. PULM: ctab, no inc wob ABD: soft, +bs EXT: no edema SKIN: Well-perfused.  The 10-year ASCVD risk score Denman George DC Montez Hageman., et al., 2013) is: 3.7%   Values used to calculate the score:     Age: 50 years     Sex: Male     Is Non-Hispanic African American: No     Diabetic: No     Tobacco smoker: No     Systolic Blood Pressure: 140 mmHg     Is BP treated: Yes     HDL Cholesterol: 61.7 mg/dL     Total Cholesterol: 197 mg/dL

## 2021-04-15 NOTE — Patient Instructions (Addendum)
Check with your insurance to see if they will cover the shingles shot. Tetanus shot today.   Don't change your meds.   I'll check on the extra heart tests.   Take care.  Glad to see you.

## 2021-04-18 DIAGNOSIS — Z8249 Family history of ischemic heart disease and other diseases of the circulatory system: Secondary | ICD-10-CM | POA: Insufficient documentation

## 2021-04-18 NOTE — Assessment & Plan Note (Signed)
Continue atorvastatin.  Continue work on diet and exercise.  Labs discussed with patient. 

## 2021-04-18 NOTE — Assessment & Plan Note (Signed)
Continue amlodipine.  Continue work on diet and exercise.  Labs discussed with patient. 

## 2021-04-18 NOTE — Assessment & Plan Note (Signed)
Living will d/w pt.  Wife designated if patient were incapacitated.   ?

## 2021-04-18 NOTE — Assessment & Plan Note (Signed)
covid vaccine 2021 Tetanus 2022 Flu shot encouraged for fall PNA not due.  shingles d/w pt.    PSA wnl 2022 Cologuard neg 2021 Diet and exercise d/w pt.   Living will d/w pt. Wife designated if patient were incapacitated. [facility]    His mother is moving into [facility].   His wife's mother has breast cancer and needed a feeding tube.  Discussed.

## 2021-04-18 NOTE — Assessment & Plan Note (Signed)
  Discussed getting cardiac CT/calcium scoring set up given his family history.  He wanted to proceed and this is reasonable.

## 2021-04-19 NOTE — Addendum Note (Signed)
Addended by: Consuella Lose on: 04/19/2021 04:02 PM   Modules accepted: Orders

## 2021-05-06 ENCOUNTER — Ambulatory Visit
Admission: RE | Admit: 2021-05-06 | Discharge: 2021-05-06 | Disposition: A | Discharge status: Home / Self Care | Payer: Managed Care, Other (non HMO) | Source: Ambulatory Visit | Attending: Family Medicine | Admitting: Family Medicine

## 2021-05-06 ENCOUNTER — Ambulatory Visit: Payer: Managed Care, Other (non HMO)

## 2021-05-06 ENCOUNTER — Other Ambulatory Visit: Payer: Self-pay

## 2021-05-06 DIAGNOSIS — Z136 Encounter for screening for cardiovascular disorders: Secondary | ICD-10-CM | POA: Diagnosis present

## 2021-05-06 DIAGNOSIS — Z8249 Family history of ischemic heart disease and other diseases of the circulatory system: Secondary | ICD-10-CM | POA: Insufficient documentation

## 2021-05-09 ENCOUNTER — Other Ambulatory Visit: Payer: Self-pay | Admitting: Family Medicine

## 2021-05-09 DIAGNOSIS — I2510 Atherosclerotic heart disease of native coronary artery without angina pectoris: Secondary | ICD-10-CM

## 2021-06-10 ENCOUNTER — Encounter: Payer: Self-pay | Admitting: Cardiology

## 2021-06-10 ENCOUNTER — Ambulatory Visit: Payer: Managed Care, Other (non HMO) | Admitting: Cardiology

## 2021-06-10 ENCOUNTER — Other Ambulatory Visit: Payer: Self-pay

## 2021-06-10 VITALS — BP 148/90 | HR 51 | Ht 74.0 in | Wt 241.0 lb

## 2021-06-10 DIAGNOSIS — Z8249 Family history of ischemic heart disease and other diseases of the circulatory system: Secondary | ICD-10-CM | POA: Diagnosis not present

## 2021-06-10 DIAGNOSIS — I10 Essential (primary) hypertension: Secondary | ICD-10-CM

## 2021-06-10 DIAGNOSIS — E7800 Pure hypercholesterolemia, unspecified: Secondary | ICD-10-CM | POA: Diagnosis not present

## 2021-06-10 MED ORDER — ATORVASTATIN CALCIUM 40 MG PO TABS
40.0000 mg | ORAL_TABLET | Freq: Every day | ORAL | 2 refills | Status: DC
Start: 2021-06-10 — End: 2022-03-14

## 2021-06-10 NOTE — Progress Notes (Signed)
Cardiology Office Note:    Date:  06/10/2021   ID:  Kalief, Kattner [birth year], MRN [id number]  PCP:  Joaquim Nam, MD   [facility] Providers Cardiologist:  Debbe Odea, MD     Referring MD: Joaquim Nam, MD   Chief Complaint  Patient presents with   New Patient (Initial Visit)    Referred by PCP for Coronary artery disease involving native heart without angina pectoris, unspecified vessel or lesion type. Meds reviewed verbally with patient.    Nicholas Vasquez is a 50 y.o. male who is being seen today for the evaluation of coronary artery disease at the request of Joaquim Nam, MD.   History of Present Illness:    Nicholas Vasquez is a 50 y.o. male with a hx of hypertension, hyperlipidemia who presents due to family history of CAD.  Patient's father had his first heart attack at age 57.  He subsequently underwent CABG and heart transplant in his 82s.  He has a sister with history of high cholesterol.  No other known cardiac disease.  Patient denies chest pain or shortness of breath.  Takes medications for hyperlipidemia and hypertension.  Blood pressure checks at home usually range from 120s to 140s.  Has been on Lipitor for the past 10+ years.  He feels well, exercises frequently by using an elliptical with no symptoms.  Had a coronary calcium score obtained last month 05/06/2021, 69.8.  Past Medical History:  Diagnosis Date   History of SCC (squamous cell carcinoma) of skin    HSV infection    oral- episodic, tx as needed    Hyperlipidemia 12-2001   Hypertension 05-1997    Past Surgical History:  Procedure Laterality Date   VASECTOMY  12/05/08   Dr. Achilles Dunk     Current Medications: Current Meds  Medication Sig   amLODipine (NORVASC) 5 MG tablet Take 1 tablet (5 mg total) by mouth daily.   valACYclovir (VALTREX) 1000 MG tablet Take 2 tablets (2,000 mg total) by mouth 2 (two) times daily. For 1 day per episode   [DISCONTINUED] atorvastatin (LIPITOR) 10 MG  tablet Take 1 tablet (10 mg total) by mouth daily.     Allergies:   Patient has no known allergies.   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     Employer: [employer]    Comment: [place] [employer]  Tobacco Use   Smoking status: Never   Smokeless tobacco: Never  Substance and Sexual Activity   Alcohol use: Yes    Comment: 2 glasses of wine/night    Drug use: No   Sexual activity: Not on file  Other Topics Concern   Not on file  Social History Narrative   Exercise- ~12 miles running per week, elliptical, and some weights   Married 1996, 2 daughters (born 2003 and 2005)   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[employer], commutes to work   [place] [employer] grad   Social Determinants of Corporate investment banker Strain: Not on file  Food Insecurity: Not on file  Transportation Needs: Not on file  Physical Activity: Not on file  Stress: Not on file  Social Connections: Not on file     Family History: The patient's family  history includes Arthritis in his mother; Coronary artery disease in his paternal grandfather; Goiter in his sister; Heart attack (age of onset: 90) in his father; Hypertension in his mother; Kidney failure in his paternal grandfather and paternal grandmother; Prostate cancer (age of onset: 73) in his father; Stroke in his father. There is no history of Diabetes, Breast cancer, Ovarian cancer, Uterine cancer, Depression, Drug abuse, Alcohol abuse, or Colon cancer.  ROS:   Please see the history of present illness.     All other systems reviewed and are negative.  EKGs/Labs/Other Studies Reviewed:    The following studies were reviewed today:   EKG:  EKG is  ordered today.  The ekg ordered today demonstrates sinus bradycardia, otherwise normal  ECG  Recent Labs: 04/08/2021: ALT 26; BUN 18; Creatinine, Ser 1.02; Potassium 4.4; Sodium 138  Recent Lipid Panel    Component Value Date/Time   CHOL 197 04/08/2021 0908   TRIG 71.0 04/08/2021 0908   HDL 61.70 04/08/2021 0908   CHOLHDL 3 04/08/2021 0908   VLDL 14.2 04/08/2021 0908   LDLCALC 121 (H) 04/08/2021 0908     Risk Assessment/Calculations:          Physical Exam:    VS:  BP (!) 148/90 (BP Location: Left Arm, Patient Position: Sitting, Cuff Size: Normal)   Pulse (!) 51   Ht 6\' 2"  (1.88 m)   Wt 241 lb (109.3 kg)   SpO2 99%   BMI 30.94 kg/m     Wt Readings from Last 3 Encounters:  06/10/21 241 lb (109.3 kg)  04/15/21 240 lb 2 oz (108.9 kg)  04/10/20 240 lb 7 oz (109.1 kg)     GEN:  Well nourished, well developed in no acute distress HEENT: Normal NECK: No JVD; No carotid bruits LYMPHATICS: No lymphadenopathy CARDIAC: RRR, no murmurs, rubs, gallops RESPIRATORY:  Clear to auscultation without rales, wheezing or rhonchi  ABDOMEN: Soft, non-tender, non-distended MUSCULOSKELETAL:  No edema; No deformity  SKIN: Warm and dry NEUROLOGIC:  Alert and oriented x 3 PSYCHIATRIC:  Normal affect   ASSESSMENT:    1. HYPERCHOLESTEROLEMIA   2. Primary hypertension   3. Family history of early CAD    PLAN:    In order of problems listed above:  Hyperlipidemia, LDL elevated.  Low calcium score.  Increase statin to moderate intensity, Lipitor to 40 mg.  Plan to repeat fasting lipid profile in 3 months. Hypertension, BP elevated, patient advised to check blood pressure frequently at home and keep a log.  If blood pressure stays elevated in the 130s to 140s, will recommend medication titration. Family history of CAD, calcium score 69.  Continue primary prevention with aggressive risk factor/hypertension, hyperlipidemia controlled as above.  Currently asymptomatic.  Consider stress testing/coronary CTA if patient develops symptoms consistent with angina.  Follow-up in 3  months      Medication Adjustments/Labs and Tests Ordered: Current medicines are reviewed at length with the patient today.  Concerns regarding medicines are outlined above.  Orders Placed This Encounter  Procedures   Lipid panel   EKG 12-Lead   Meds ordered this encounter  Medications   atorvastatin (LIPITOR) 40 MG tablet    Sig: Take 1 tablet (40 mg total) by mouth daily.    Dispense:  90 tablet    Refill:  2    Patient Instructions  Medication Instructions:    INCREASE your Lipitor to 40 MG once a day.  *If you need a refill on your cardiac medications before  your next appointment, please call your pharmacy*   Lab Work:  Your physician recommends that you return for a FASTING lipid profile: IN 3 MONTHS  - You will need to be fasting. Please do not have anything to eat or drink after midnight the morning you have the lab work. You may only have water or black coffee with no cream or sugar.  - Please go to the [facility]. You will check in at the front desk to the right as you walk into the atrium. Valet Parking is offered if needed. - No appointment needed. You may go any day between 7 am and 6 pm.    Testing/Procedures: None ordered   Follow-Up: At [facility], you and your health needs are our priority.  As part of our continuing mission to provide you with exceptional heart care, we have created designated Provider Care Teams.  These Care Teams include your primary Cardiologist (physician) and Advanced Practice Providers (APPs -  Physician Assistants and Nurse Practitioners) who all work together to provide you with the care you need, when you need it.  We recommend signing up for the patient portal called "MyChart".  Sign up information is provided on this After Visit Summary.  MyChart is used to connect with patients for Virtual Visits (Telemedicine).  Patients are able to view lab/test results, encounter notes, upcoming appointments, etc.  Non-urgent  messages can be sent to your provider as well.   To learn more about what you can do with MyChart, go to [email or web address].    Your next appointment:   3 month(s)  The format for your next appointment:   In Person  Provider:   Debbe Odea, MD   Other Instructions      Signed, Debbe Odea, MD  06/10/2021 1:16 PM    [facility]

## 2021-06-10 NOTE — Patient Instructions (Signed)
Medication Instructions:    INCREASE your Lipitor to 40 MG once a day.  *If you need a refill on your cardiac medications before your next appointment, please call your pharmacy*   Lab Work:  Your physician recommends that you return for a FASTING lipid profile: IN 3 MONTHS  - You will need to be fasting. Please do not have anything to eat or drink after midnight the morning you have the lab work. You may only have water or black coffee with no cream or sugar.  - Please go to the [facility]. You will check in at the front desk to the right as you walk into the atrium. Valet Parking is offered if needed. - No appointment needed. You may go any day between 7 am and 6 pm.    Testing/Procedures: None ordered   Follow-Up: At [facility], you and your health needs are our priority.  As part of our continuing mission to provide you with exceptional heart care, we have created designated Provider Care Teams.  These Care Teams include your primary Cardiologist (physician) and Advanced Practice Providers (APPs -  Physician Assistants and Nurse Practitioners) who all work together to provide you with the care you need, when you need it.  We recommend signing up for the patient portal called "MyChart".  Sign up information is provided on this After Visit Summary.  MyChart is used to connect with patients for Virtual Visits (Telemedicine).  Patients are able to view lab/test results, encounter notes, upcoming appointments, etc.  Non-urgent messages can be sent to your provider as well.   To learn more about what you can do with MyChart, go to [email or web address].    Your next appointment:   3 month(s)  The format for your next appointment:   In Person  Provider:   Debbe Odea, MD   Other Instructions

## 2021-06-30 ENCOUNTER — Ambulatory Visit
Admission: RE | Admit: 2021-06-30 | Discharge: 2021-06-30 | Disposition: A | Discharge status: Home / Self Care | Payer: Managed Care, Other (non HMO) | Source: Ambulatory Visit | Attending: Emergency Medicine | Admitting: Emergency Medicine

## 2021-06-30 ENCOUNTER — Telehealth: Payer: Self-pay

## 2021-06-30 ENCOUNTER — Other Ambulatory Visit: Payer: Self-pay

## 2021-06-30 VITALS — BP 160/90 | HR 80 | Temp 99.4°F | Resp 18

## 2021-06-30 DIAGNOSIS — I10 Essential (primary) hypertension: Secondary | ICD-10-CM

## 2021-06-30 DIAGNOSIS — U071 COVID-19: Secondary | ICD-10-CM

## 2021-06-30 MED ORDER — NIRMATRELVIR/RITONAVIR (PAXLOVID)TABLET: 3.0000 | ORAL_TABLET | Freq: Two times a day (BID) | ORAL | 0 refills | Status: AC

## 2021-06-30 NOTE — ED Triage Notes (Signed)
Pt c/o dry cough and fever since Monday night. Positive home covid test. Last ibuprofen at 130p.

## 2021-06-30 NOTE — ED Provider Notes (Signed)
Renaldo Fiddler    CSN: [id number] Arrival date & time: 06/30/21  1530      History   Chief Complaint Chief Complaint  Patient presents with   Covid Positive    HPI Nicholas Vasquez is a 50 y.o. male.  Patient presents with fever, chills, body aches, runny nose, congestion, cough, nausea, vomiting, diarrhea x2 days.  He tested COVID positive at home yesterday.  Treatment at home with ibuprofen.  He denies rash, sore throat, shortness of breath, or other symptoms.  No emesis or diarrhea today.  Patient states he is able to stay hydrated at home.  His medical history includes hypertension and hyperlipidemia.  The history is provided by the patient and medical records.   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 Achilles Dunk        Home Medications    Prior to Admission medications   Medication Sig Start Date End Date Taking? Authorizing Provider  nirmatrelvir/ritonavir EUA (PAXLOVID) 20 x 150 MG & 10 x 100MG  TABS Take 3 tablets by mouth 2 (two) times daily for 5 days. Patient GFR is 85. Take nirmatrelvir (150 mg) two tablets twice daily for 5 days and ritonavir (100 mg) one tablet twice daily for 5 days. 06/30/21 07/05/21 Yes 09/04/21, NP  amLODipine (NORVASC) 5 MG tablet Take 1 tablet (5 mg total) by mouth daily. 04/15/21   04/17/21, MD  atorvastatin (LIPITOR) 40 MG tablet Take 1 tablet (40 mg total) by mouth daily. 06/10/21   08/10/21, MD  valACYclovir (VALTREX) 1000 MG tablet Take 2 tablets (2,000 mg total) by mouth 2 (two) times daily. For 1 day per episode 04/15/21   04/17/21, MD    Family History Family History  Problem Relation Age of Onset   Stroke Father    Heart attack Father 15       s/p heart transplant   Prostate cancer Father 42   Coronary artery disease Paternal Grandfather    Kidney failure Paternal Grandfather    Goiter Sister    Arthritis Mother    Hypertension Mother    Kidney failure Paternal Grandmother    Diabetes Neg Hx    Breast cancer Neg Hx    Ovarian cancer Neg Hx    Uterine cancer Neg Hx    Depression Neg Hx    Drug abuse Neg Hx    Alcohol abuse Neg Hx    Colon cancer Neg Hx     Social History Social History   Tobacco Use   Smoking status: Never   Smokeless tobacco: Never  Substance Use Topics   Alcohol use: Yes    Comment: 2 glasses of wine/night    Drug use: No     Allergies   Patient has no known  allergies.   Review of Systems Review of Systems  Constitutional:  Positive for chills and fever.  HENT:  Positive for congestion and rhinorrhea. Negative for ear pain and sore throat.   Respiratory:  Positive for cough. Negative for shortness of breath.   Cardiovascular:  Negative for chest pain and palpitations.  Gastrointestinal:  Positive for diarrhea, nausea and vomiting. Negative for abdominal pain.  Skin:  Negative for color change and rash.  All other systems reviewed and are negative.   Physical Exam Triage Vital Signs ED Triage Vitals  Enc Vitals Group     BP 06/30/21 1541 (!) 160/90     Pulse Rate 06/30/21 1541 80     Resp 06/30/21 1541 18     Temp 06/30/21 1541 99.4 F (37.4 C)     Temp Source 06/30/21 1541 Oral     SpO2 06/30/21 1541 98 %     Weight --      Height --      Head Circumference --      Peak Flow --      Pain  Score 06/30/21 1543 0     Pain Loc --      Pain Edu? --      Excl. in GC? --    No data found.  Updated Vital Signs BP (!) 160/90 (BP Location: Left Arm)   Pulse 80   Temp 99.4 F (37.4 C) (Oral)   Resp 18   SpO2 98%   Visual Acuity Right Eye Distance:   Left Eye Distance:   Bilateral Distance:    Right Eye Near:   Left Eye Near:    Bilateral Near:     Physical Exam Vitals and nursing note reviewed.  Constitutional:      General: He is not in acute distress.    Appearance: He is well-developed.  HENT:     Head: Normocephalic and atraumatic.     Right Ear: Tympanic membrane normal.     Left Ear: Tympanic membrane normal.     Nose: Nose normal.     Mouth/Throat:     Mouth: Mucous membranes are moist.     Pharynx: Oropharynx is clear.  Eyes:     Conjunctiva/sclera: Conjunctivae normal.  Cardiovascular:     Rate and Rhythm: Normal rate and regular rhythm.     Heart sounds: Normal heart sounds.  Pulmonary:     Effort: Pulmonary effort is normal. No respiratory distress.     Breath sounds: Normal breath sounds.  Abdominal:     General: Bowel sounds are normal.     Palpations: Abdomen is soft.     Tenderness: There is no abdominal tenderness. There is no guarding or rebound.  Musculoskeletal:     Cervical back: Neck supple.  Skin:    General: Skin is warm and dry.  Neurological:     General: No focal deficit present.     Mental Status: He is alert and oriented to person, place, and time.     Gait: Gait normal.  Psychiatric:        Mood and Affect: Mood normal.        Behavior: Behavior normal.     UC Treatments / Results  Labs (all labs ordered are listed, but only abnormal results are displayed) Labs Reviewed - No data to display  EKG   Radiology No results found.  Procedures Procedures (including critical care time)  Medications Ordered in UC Medications - No data to display  Initial  Impression / Assessment and Plan / UC Course  I have  reviewed the triage vital signs and the nursing notes.  Pertinent labs & imaging results that were available during my care of the patient were reviewed by me and considered in my medical decision making (see chart for details).   COVID-19, elevated blood pressure with HTN.  Patient requests treatment with COVID antiviral.  Treating with Paxlovid.  GFR 85 on 04/08/2021.  I discussed the side effects of Paxlovid, including dysgeusia, diarrhea, myalgias, hypertension.  I also discussed the possibility of rebound COVID.  Instructed patient to notify his PCP that he is COVID positive and taking Paxlovid.  ED precautions discussed if he develops shortness of breath or other concerning symptoms.  Instructed patient to self quarantine per CDC guidelines.  Instructed him to pause in taking his atorvastatin for 7 days.  Discussed that his blood pressure is elevated today and needs to be rechecked by his PCP in 2 to 4 weeks.  Education provided on managing hypertension.  Patient agrees to plan of care.   Final Clinical Impressions(s) / UC Diagnoses   Final diagnoses:  COVID-19  Elevated blood pressure reading in office with diagnosis of hypertension     Discharge Instructions      Take the Paxlovid as directed.  Take Tylenol or ibuprofen as needed for fever or discomfort.  Rest and keep yourself hydrated.  Continue to quarantine per CDC guidelines.    Stop taking your atorvastatin for 7 days.   Go to the emergency department if you have shortness of breath or other concerning symptoms.    Call your primary care provider to let them know that you are COVID positive and taking Paxlovid.    Your blood pressure is elevated today at 160/90.  Please have this rechecked by your primary care provider in 2-4 weeks.          ED Prescriptions     Medication Sig Dispense Auth. Provider   nirmatrelvir/ritonavir EUA (PAXLOVID) 20 x 150 MG & 10 x 100MG  TABS Take 3 tablets by mouth 2 (two) times daily for 5  days. Patient GFR is 85. Take nirmatrelvir (150 mg) two tablets twice daily for 5 days and ritonavir (100 mg) one tablet twice daily for 5 days. 30 tablet , NP      PDMP not reviewed this encounter.   Mickie Bail, NP 06/30/21 1610

## 2021-06-30 NOTE — Telephone Encounter (Signed)
Ronaldo Miyamoto (DPR signed) said that pt tested + on 06/29/21 for covid. Ronaldo Miyamoto thinks pts symptoms started on 06/28/21 with fever,chills, body aches, N& V on 06/29/21 and wife thinks vomited x 1 but pt is not eating a lot but pt is able to drink and pt is trying to drink a lot of water.pt's temp today is 102.6 and pt has taken ibuprofen 400 mg. Pt also taking mucinex OTC.pt has had mild diarrhea x 1 in last 12-24 hrs. Pt has head congestion and runny nose. Pt has dry cough and prod cough with clear phlegm that is getting thicker and blood tinged. Pt wants antiviral, pt does not have H/A, SOB,S/T or loss of taste or smell.no available video visits at [facility] today and I scheduled pt an appt today at [facility] in [place] at 3:45. Self quarantine, drink plenty of fluids, rest, and take Tylenol for fever. UC & ED precautions given and pt voiced understanding. Ronaldo Miyamoto appreciative for appt. Sending note to Dr Para March as Lorain Childes and Shanda Bumps CMA.

## 2021-06-30 NOTE — Telephone Encounter (Signed)
Agree with UC. Thanks.

## 2021-06-30 NOTE — Discharge Instructions (Addendum)
Take the Paxlovid as directed.  Take Tylenol or ibuprofen as needed for fever or discomfort.  Rest and keep yourself hydrated.  Continue to quarantine per CDC guidelines.    Stop taking your atorvastatin for 7 days.   Go to the emergency department if you have shortness of breath or other concerning symptoms.    Call your primary care provider to let them know that you are COVID positive and taking Paxlovid.    Your blood pressure is elevated today at 160/90.  Please have this rechecked by your primary care provider in 2-4 weeks.

## 2021-09-06 ENCOUNTER — Other Ambulatory Visit (INDEPENDENT_AMBULATORY_CARE_PROVIDER_SITE_OTHER): Payer: Managed Care, Other (non HMO)

## 2021-09-06 ENCOUNTER — Other Ambulatory Visit: Payer: Self-pay

## 2021-09-06 ENCOUNTER — Other Ambulatory Visit: Payer: Managed Care, Other (non HMO)

## 2021-09-06 DIAGNOSIS — E7800 Pure hypercholesterolemia, unspecified: Secondary | ICD-10-CM

## 2021-09-07 LAB — LIPID PANEL
Chol/HDL Ratio: 3.1 ratio (ref 0.0–5.0)
Cholesterol, Total: 174 mg/dL (ref 100–199)
HDL: 56 mg/dL (ref >39)
LDL Chol Calc (NIH): 105 mg/dL — ABNORMAL HIGH (ref 0–99)
Triglycerides: 67 mg/dL (ref 0–149)
VLDL Cholesterol Cal: 13 mg/dL (ref 5–40)

## 2021-09-10 ENCOUNTER — Other Ambulatory Visit: Payer: Self-pay

## 2021-09-10 ENCOUNTER — Ambulatory Visit: Payer: Managed Care, Other (non HMO) | Admitting: Cardiology

## 2021-09-10 ENCOUNTER — Encounter: Payer: Self-pay | Admitting: Cardiology

## 2021-09-10 VITALS — BP 144/76 | HR 63 | Ht 74.0 in | Wt 237.0 lb

## 2021-09-10 DIAGNOSIS — Z8249 Family history of ischemic heart disease and other diseases of the circulatory system: Secondary | ICD-10-CM

## 2021-09-10 DIAGNOSIS — I10 Essential (primary) hypertension: Secondary | ICD-10-CM | POA: Diagnosis not present

## 2021-09-10 DIAGNOSIS — E7800 Pure hypercholesterolemia, unspecified: Secondary | ICD-10-CM | POA: Diagnosis not present

## 2021-09-10 MED ORDER — AMLODIPINE BESYLATE 10 MG PO TABS: 10.0000 mg | ORAL_TABLET | Freq: Every day | ORAL | 3 refills | Status: DC

## 2021-09-10 NOTE — Progress Notes (Signed)
Cardiology Office Note:    Date:  09/10/2021   ID:  Nicholas, Vasquez Mar 21, 1971, MRN [id number]  PCP:  Joaquim Nam, MD   [facility] Providers Cardiologist:  Debbe Odea, MD     Referring MD: Joaquim Nam, MD   Chief Complaint  Patient presents with   Other    3 month follow up -- Meds reviewed verbally with patient.     History of Present Illness:    Nicholas Vasquez is a 50 y.o. male with a hx of hypertension, hyperlipidemia who presents for follow-up.  Previously seen due to family history of CAD.  Coronary calcium score was low, Lipitor was increased to 40 mg daily.  Blood pressure was elevated, takes amlodipine 5 mg daily as prescribed.  Checks blood pressure occasionally at home, systolics in the 140s.  Denies chest pain or shortness of breath.  Prior notes coronary calcium score  05/06/2021 was 69.8.  Past Medical History:  Diagnosis Date   History of SCC (squamous cell carcinoma) of skin    HSV infection    oral- episodic, tx as needed    Hyperlipidemia 12-2001   Hypertension 05-1997    Past Surgical History:  Procedure Laterality Date   VASECTOMY  12/05/08   Dr. Achilles Dunk     Current Medications: Current Meds  Medication Sig   amLODipine (NORVASC) 10 MG tablet Take 1 tablet (10 mg total) by mouth daily.   atorvastatin (LIPITOR) 40 MG tablet Take 1 tablet (40 mg total) by mouth daily.   valACYclovir (VALTREX) 1000 MG tablet Take 2 tablets (2,000 mg total) by mouth 2 (two) times daily. For 1 day per episode   [DISCONTINUED] amLODipine (NORVASC) 5 MG tablet Take 1 tablet (5 mg total) by mouth daily.     Allergies:   Patient has no known allergies.   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     Employer: [employer]    Comment: [place] [employer]  Tobacco Use   Smoking status: Never   Smokeless tobacco: Never  Substance and Sexual Activity   Alcohol use: Yes    Comment: 2 glasses of wine/night    Drug use: No   Sexual activity: Not on file  Other Topics Concern   Not on file  Social History Narrative   Exercise- ~12 miles running per week, elliptical, and some weights   Married 1996, 2 daughters (born 2003 and 2005)   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[employer], commutes to work   [employer] grad   Social Determinants of Corporate investment banker Strain: Not on Ship broker Insecurity: Not on file  Transportation Needs: Not on file  Physical Activity: Not on file  Stress: Not on file  Social Connections: Not on file     Family History: The patient's family history includes Arthritis in his mother; Coronary artery disease in his paternal grandfather; Goiter in his sister; Heart attack (age of onset: 4) in his father; Hypertension in his mother; Kidney failure in his paternal grandfather and paternal grandmother; Prostate cancer (age of onset: 22) in his father; Stroke in his father. There is no history of Diabetes, Breast cancer, Ovarian cancer, Uterine cancer, Depression, Drug abuse, Alcohol abuse, or  Colon cancer.  ROS:   Please see the history of present illness.     All other systems reviewed and are negative.  EKGs/Labs/Other Studies Reviewed:    The following studies were reviewed today:   EKG:  EKG is  ordered today.  The ekg ordered today demonstrates normal sinus rhythm, normal ECG.  Recent Labs: 04/08/2021: ALT 26; BUN 18; Creatinine, Ser 1.02; Potassium 4.4; Sodium 138  Recent Lipid Panel    Component Value Date/Time   CHOL 174 09/06/2021 0815   TRIG 67 09/06/2021 0815   HDL 56 09/06/2021 0815   CHOLHDL 3.1 09/06/2021 0815   CHOLHDL 3 04/08/2021 0908   VLDL 14.2 04/08/2021 0908   LDLCALC 105 (H) 09/06/2021 0815     Risk Assessment/Calculations:          Physical Exam:     VS:  BP (!) 144/76 (BP Location: Left Arm, Patient Position: Sitting, Cuff Size: Normal)   Pulse 63   Ht 6\' 2"  (1.88 m)   Wt 237 lb (107.5 kg)   SpO2 99%   BMI 30.43 kg/m     Wt Readings from Last 3 Encounters:  09/10/21 237 lb (107.5 kg)  06/10/21 241 lb (109.3 kg)  04/15/21 240 lb 2 oz (108.9 kg)     GEN:  Well nourished, well developed in no acute distress HEENT: Normal NECK: No JVD; No carotid bruits LYMPHATICS: No lymphadenopathy CARDIAC: RRR, no murmurs, rubs, gallops RESPIRATORY:  Clear to auscultation without rales, wheezing or rhonchi  ABDOMEN: Soft, non-tender, non-distended MUSCULOSKELETAL:  No edema; No deformity  SKIN: Warm and dry NEUROLOGIC:  Alert and oriented x 3 PSYCHIATRIC:  Normal affect   ASSESSMENT:    1. Pure hypercholesterolemia   2. Primary hypertension   3. Family history of early CAD     PLAN:    In order of problems listed above:  Hyperlipidemia, LDL elevated.  Low calcium score of 69.  Continue Lipitor 40 mg daily, low-cholesterol diet advised.  Repeat fasting lipid profile in 6 months. Hypertension, BP elevated, increase amlodipine to 10 mg daily.  Follow-up in 6 months    Medication Adjustments/Labs and Tests Ordered: Current medicines are reviewed at length with the patient today.  Concerns regarding medicines are outlined above.  Orders Placed This Encounter  Procedures   Lipid panel   EKG 12-Lead    Meds ordered this encounter  Medications   amLODipine (NORVASC) 10 MG tablet    Sig: Take 1 tablet (10 mg total) by mouth daily.    Dispense:  180 tablet    Refill:  3     Patient Instructions  Medication Instructions:   Your physician has recommended you make the following change in your medication:   INCREASE Amlodipine - Take one tablet (10MG ) by mouth daily.   *If you need a refill on your cardiac medications before your next appointment, please call your pharmacy*   Lab Work:  Your physician recommends that  you return for lab work in: 6 months here at our office.   If you have labs (blood work) drawn today and your tests are completely normal, you will receive your results only by: MyChart Message (if you have MyChart) OR A paper copy in the mail If you have any lab test that is abnormal or we need to change your treatment, we will call you to review the results.   Testing/Procedures:  None Ordered  Follow-Up: At [facility], you and your health needs are our priority.  As part of our continuing mission to provide you with exceptional heart care, we have created designated Provider Care Teams.  These Care Teams include your primary Cardiologist (physician) and Advanced Practice Providers (APPs -  Physician Assistants and Nurse Practitioners) who all work together to provide you with the care you need, when you need it.  We recommend signing up for the patient portal called "MyChart".  Sign up information is provided on this After Visit Summary.  MyChart is used to connect with patients for Virtual Visits (Telemedicine).  Patients are able to view lab/test results, encounter notes, upcoming appointments, etc.  Non-urgent messages can be sent to your provider as well.   To learn more about what you can do with MyChart, go to [email or web address].    Your next appointment:   6 month(s) -- After Lab work  The format for your next appointment:   In Person  Provider:   You may see Debbe Odea, MD or one of the following Advanced Practice Providers on your designated Care Team:   Nicolasa Ducking, NP Eula Listen, PA-C Cadence Fransico Michael, PA-C{       Signed, Debbe Odea, MD  09/10/2021 2:55 PM    [facility]

## 2021-09-10 NOTE — Patient Instructions (Signed)
Medication Instructions:   Your physician has recommended you make the following change in your medication:   INCREASE Amlodipine - Take one tablet (10MG ) by mouth daily.   *If you need a refill on your cardiac medications before your next appointment, please call your pharmacy*   Lab Work:  Your physician recommends that you return for lab work in: 6 months here at our office.   If you have labs (blood work) drawn today and your tests are completely normal, you will receive your results only by: MyChart Message (if you have MyChart) OR A paper copy in the mail If you have any lab test that is abnormal or we need to change your treatment, we will call you to review the results.   Testing/Procedures:  None Ordered  Follow-Up: At [facility], you and your health needs are our priority.  As part of our continuing mission to provide you with exceptional heart care, we have created designated Provider Care Teams.  These Care Teams include your primary Cardiologist (physician) and Advanced Practice Providers (APPs -  Physician Assistants and Nurse Practitioners) who all work together to provide you with the care you need, when you need it.  We recommend signing up for the patient portal called "MyChart".  Sign up information is provided on this After Visit Summary.  MyChart is used to connect with patients for Virtual Visits (Telemedicine).  Patients are able to view lab/test results, encounter notes, upcoming appointments, etc.  Non-urgent messages can be sent to your provider as well.   To learn more about what you can do with MyChart, go to [facility].    Your next appointment:   6 month(s) -- After Lab work  The format for your next appointment:   In Person  Provider:   You may see [email or web address], MD or one of the following Advanced Practice Providers on your designated Care Team:   Debbe Odea, NP Nicolasa Ducking, PA-C Cadence Eula Listen, Fransico Michael

## 2021-09-28 ENCOUNTER — Other Ambulatory Visit: Payer: Managed Care, Other (non HMO)

## 2022-03-11 ENCOUNTER — Other Ambulatory Visit
Admission: RE | Admit: 2022-03-11 | Discharge: 2022-03-11 | Disposition: A | Discharge status: Home / Self Care | Payer: Managed Care, Other (non HMO) | Attending: Cardiology | Admitting: Cardiology

## 2022-03-11 DIAGNOSIS — E7800 Pure hypercholesterolemia, unspecified: Secondary | ICD-10-CM | POA: Insufficient documentation

## 2022-03-11 LAB — LIPID PANEL
Cholesterol: 168 mg/dL (ref 0–200)
HDL: 68 mg/dL (ref >40)
LDL Cholesterol: 92 mg/dL (ref 0–99)
Total CHOL/HDL Ratio: 2.5 RATIO
Triglycerides: 42 mg/dL (ref <150)
VLDL: 8 mg/dL (ref 0–40)

## 2022-03-14 ENCOUNTER — Ambulatory Visit: Payer: Managed Care, Other (non HMO) | Admitting: Cardiology

## 2022-03-14 ENCOUNTER — Encounter: Payer: Self-pay | Admitting: Cardiology

## 2022-03-14 VITALS — BP 140/72 | HR 66 | Ht 74.0 in | Wt 243.0 lb

## 2022-03-14 DIAGNOSIS — E7800 Pure hypercholesterolemia, unspecified: Secondary | ICD-10-CM

## 2022-03-14 DIAGNOSIS — I10 Essential (primary) hypertension: Secondary | ICD-10-CM

## 2022-03-14 MED ORDER — AMLODIPINE BESYLATE 5 MG PO TABS: 5.0000 mg | ORAL_TABLET | Freq: Every day | ORAL | 1 refills | Status: DC

## 2022-03-14 MED ORDER — ATORVASTATIN CALCIUM 40 MG PO TABS: 40.0000 mg | ORAL_TABLET | Freq: Every day | ORAL | 3 refills | Status: DC

## 2022-03-14 MED ORDER — LOSARTAN POTASSIUM 50 MG PO TABS: 50.0000 mg | ORAL_TABLET | Freq: Every day | ORAL | 1 refills | Status: DC

## 2022-03-14 MED ORDER — AMLODIPINE BESYLATE 5 MG PO TABS: 5.0000 mg | ORAL_TABLET | Freq: Every day | ORAL | 3 refills | Status: DC

## 2022-03-14 MED ORDER — LOSARTAN POTASSIUM 50 MG PO TABS: 50.0000 mg | ORAL_TABLET | Freq: Every day | ORAL | 3 refills | Status: DC

## 2022-03-14 NOTE — Progress Notes (Signed)
?Cardiology Office Note:   ? ?Date:  03/14/2022  ? ?ID:  Nicholas Vasquez, DOB Jul 20, 1971, MRN [id number] ? ?PCP:  Joaquim Namuncan, Graham S, MD ?  ?[facility] Providers ?Cardiologist:  Debbe OdeaBrian Agbor-Etang, MD    ? ?Referring MD: Joaquim Namuncan, Graham S, MD  ? ?Chief Complaint  ?Patient presents with  ? OTher  ?  6 month follow up -- Patient c.o swelling in ankles. Meds reviewed verbally with patient.   ? ? ?History of Present Illness:   ? ?Nicholas Vasquez is a 51 y.o. male with a hx of hypertension, hyperlipidemia who presents for follow-up.   ? ?He was previously seen for htn. Amlodpine was increased to 10mg  qd. He states developing peripheral edema since increasing dose of amlodipine.  He previously was on 5 mg without any edema.  Takes Lipitor as prescribed, recent cholesterol testing was well controlled. ? ? ?Prior notes ?coronary calcium score  05/06/2021 was 69.8. ? ?Past Medical History:  ?Diagnosis Date  ? History of SCC (squamous cell carcinoma) of skin   ? HSV infection   ? oral- episodic, tx as needed   ? Hyperlipidemia 12-2001  ? Hypertension 05-1997  ? ? ?Past Surgical History:  ?Procedure Laterality Date  ? VASECTOMY  12/05/08  ? Dr. Achilles Dunkope   ? ? ?Current Medications: ?Current Meds  ?Medication Sig  ? valACYclovir (VALTREX) 1000 MG tablet Take 2 tablets (2,000 mg total) by mouth 2 (two) times daily. For 1 day per episode  ? [DISCONTINUED] amLODipine (NORVASC) 10 MG tablet Take 1 tablet (10 mg total) by mouth daily.  ? [DISCONTINUED] atorvastatin (LIPITOR) 40 MG tablet Take 1 tablet (40 mg total) by mouth daily.  ? [DISCONTINUED] losartan (COZAAR) 50 MG tablet Take 1 tablet (50 mg total) by mouth daily.  ?  ? ?Allergies:   Patient has no known allergies.  ? ?Social History  ? ?Socioeconomic History  ? Marital status: Married  ?  Spouse name: Not on file  ? Number of children: 2  ? Years of education: Not on file  ? Highest education level: Not on file  ?Occupational History  ? 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  Employer: [employer]  ?  Comment: [place] [employer]  ?Tobacco Use  ? Smoking status: Never  ? Smokeless tobacco: Never  ?Substance and Sexual Activity  ? Alcohol use: Yes  ?  Comment: 2 glasses of wine/night   ? Drug use: No  ? Sexual activity: Not on file  ?Other Topics Concern  ? Not on file  ?Social History Narrative  ? Exercise- ~12 miles running per week, elliptical, and some weights  ? Married 1996, 2 daughters (born 2003 and 2005)  ? 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 [place] grad  ? ?Social Determinants of Health  ? ?Financial Resource Strain: Not on file  ?Food Insecurity: Not on file  ?Transportation Needs: Not on file  ?Physical Activity: Not on file  ?Stress: Not on file  ?Social Connections: Not on file  ?  ? ?Family History: ?The patient's family history includes Arthritis in his mother; Coronary artery disease in his paternal grandfather; Goiter in his sister; Heart attack (age of onset: 2248) in his father; Hypertension in his mother; Kidney failure in his paternal grandfather and paternal grandmother; Prostate cancer (age of onset: 3466) in his father; Stroke in his father. There is no history of Diabetes, Breast cancer, Ovarian cancer, Uterine cancer, Depression, Drug abuse, Alcohol abuse,  or Colon cancer. ? ?ROS:   ?Please see the history of present illness.    ? All other systems reviewed and are negative. ? ?EKGs/Labs/Other Studies Reviewed:   ? ?The following studies were reviewed today: ? ? ?EKG:  EKG is  ordered today.  The ekg ordered today demonstrates normal sinus rhythm, normal ECG. ? ?Recent Labs: ?04/08/2021: ALT 26; BUN 18; Creatinine, Ser 1.02; Potassium 4.4; Sodium 138  ?Recent Lipid Panel ?   ?Component Value Date/Time  ? CHOL 168 03/11/2022 1035  ? CHOL 174 09/06/2021 0815  ? TRIG 42 03/11/2022 1035  ? HDL 68 03/11/2022 1035  ? HDL 56 09/06/2021 0815  ? CHOLHDL 2.5 03/11/2022 1035  ? VLDL 8 03/11/2022 1035  ? LDLCALC 92 03/11/2022 1035  ? LDLCALC 105 (H) 09/06/2021  0815  ? ? ? ?Risk Assessment/Calculations:   ? ? ?    ? ?Physical Exam:   ? ?VS:  BP 140/72 (BP Location: Left Arm, Patient Position: Sitting, Cuff Size: Normal)   Pulse 66   Ht 6\' 2"  (1.88 m)   Wt 243 lb (110.2 kg)   SpO2 98%   BMI 31.20 kg/m?    ? ?Wt Readings from Last 3 Encounters:  ?03/14/22 243 lb (110.2 kg)  ?09/10/21 237 lb (107.5 kg)  ?06/10/21 241 lb (109.3 kg)  ?  ? ?GEN:  Well nourished, well developed in no acute distress ?HEENT: Normal ?NECK: No JVD; No carotid bruits ?CARDIAC: RRR, no murmurs, rubs, gallops ?RESPIRATORY:  Clear to auscultation without rales, wheezing or rhonchi  ?ABDOMEN: Soft, non-tender, non-distended ?MUSCULOSKELETAL:  No edema; No deformity  ?SKIN: Warm and dry ?NEUROLOGIC:  Alert and oriented x 3 ?PSYCHIATRIC:  Normal affect  ? ?ASSESSMENT:   ? ?1. Primary hypertension   ?2. Pure hypercholesterolemia   ? ? ? ?PLAN:   ? ?In order of problems listed above: ? ?Hypertension, BP elevated.  Peripheral edema likely from amlodipine.  Start losartan 50 mg daily, reduce amlodipine to 5 mg daily. ?Hyperlipidemia, cholesterol controlled.  Continue Lipitor 40 mg daily. ? ?Follow-up in 3 months ? ? ? ?Medication Adjustments/Labs and Tests Ordered: ?Current medicines are reviewed at length with the patient today.  Concerns regarding medicines are outlined above.  ?Orders Placed This Encounter  ?Procedures  ? EKG 12-Lead  ? ? ?Meds ordered this encounter  ?Medications  ? atorvastatin (LIPITOR) 40 MG tablet  ?  Sig: Take 1 tablet (40 mg total) by mouth daily.  ?  Dispense:  90 tablet  ?  Refill:  3  ? DISCONTD: amLODipine (NORVASC) 5 MG tablet  ?  Sig: Take 1 tablet (5 mg total) by mouth daily.  ?  Dispense:  30 tablet  ?  Refill:  3  ? DISCONTD: losartan (COZAAR) 50 MG tablet  ?  Sig: Take 1 tablet (50 mg total) by mouth daily.  ?  Dispense:  30 tablet  ?  Refill:  3  ? amLODipine (NORVASC) 5 MG tablet  ?  Sig: Take 1 tablet (5 mg total) by mouth daily.  ?  Dispense:  90 tablet  ?  Refill:   1  ? losartan (COZAAR) 50 MG tablet  ?  Sig: Take 1 tablet (50 mg total) by mouth daily.  ?  Dispense:  90 tablet  ?  Refill:  1  ? ? ? ?Patient Instructions  ?Medication Instructions:  ? ?Your physician has recommended you make the following change in your medication:  ? ? DECREASE your  Amlodipine to 5 MG once a day. ? ?2.    START taking Losartan 50 MG once a day. ? ?*If you need a refill on your cardiac medications before your next appointment, please call your pharmacy* ? ? ?Lab Work: ? ?None ordered ? ?If you have labs (blood work) drawn today and your tests are completely normal, you will receive your results only by: ?MyChart Message (if you have MyChart) OR ?A paper copy in the mail ?If you have any lab test that is abnormal or we need to change your treatment, we will call you to review the results. ? ? ?Testing/Procedures: ? ?None Ordered ? ? ?Follow-Up: ?At [facility], you and your health needs are our priority.  As part of our continuing mission to provide you with exceptional heart care, we have created designated Provider Care Teams.  These Care Teams include your primary Cardiologist (physician) and Advanced Practice Providers (APPs -  Physician Assistants and Nurse Practitioners) who all work together to provide you with the care you need, when you need it. ? ?We recommend signing up for the patient portal called "MyChart".  Sign up information is provided on this After Visit Summary.  MyChart is used to connect with patients for Virtual Visits (Telemedicine).  Patients are able to view lab/test results, encounter notes, upcoming appointments, etc.  Non-urgent messages can be sent to your provider as well.   ?To learn more about what you can do with MyChart, go to [email or web address].   ? ?Your next appointment:   ?6-8 week(s) ? ?The format for your next appointment:   ?In Person ? ?Provider:   ?You may see Debbe Odea, MD or one of the following Advanced Practice Providers on your  designated Care Team:   ?Nicolasa Ducking, NP ?Eula Listen, PA-C ?Cadence Fransico Michael, PA-C  ? ? ?Other Instructions ? ? ?Important Information About Sugar ? ? ? ? ? ?  ? ?Signed, ?Debbe Odea, MD  ?03/14/2022

## 2022-03-14 NOTE — Patient Instructions (Signed)
Medication Instructions:  ? ?Your physician has recommended you make the following change in your medication:  ? ? DECREASE your  Amlodipine to 5 MG once a day. ? ?2.    START taking Losartan 50 MG once a day. ? ?*If you need a refill on your cardiac medications before your next appointment, please call your pharmacy* ? ? ?Lab Work: ? ?None ordered ? ?If you have labs (blood work) drawn today and your tests are completely normal, you will receive your results only by: ?MyChart Message (if you have MyChart) OR ?A paper copy in the mail ?If you have any lab test that is abnormal or we need to change your treatment, we will call you to review the results. ? ? ?Testing/Procedures: ? ?None Ordered ? ? ?Follow-Up: ?At [facility], you and your health needs are our priority.  As part of our continuing mission to provide you with exceptional heart care, we have created designated Provider Care Teams.  These Care Teams include your primary Cardiologist (physician) and Advanced Practice Providers (APPs -  Physician Assistants and Nurse Practitioners) who all work together to provide you with the care you need, when you need it. ? ?We recommend signing up for the patient portal called "MyChart".  Sign up information is provided on this After Visit Summary.  MyChart is used to connect with patients for Virtual Visits (Telemedicine).  Patients are able to view lab/test results, encounter notes, upcoming appointments, etc.  Non-urgent messages can be sent to your provider as well.   ?To learn more about what you can do with MyChart, go to [email or web address].   ? ?Your next appointment:   ?6-8 week(s) ? ?The format for your next appointment:   ?In Person ? ?Provider:   ?You may see Debbe Odea, MD or one of the following Advanced Practice Providers on your designated Care Team:   ?Nicolasa Ducking, NP ?Eula Listen, PA-C ?Cadence Fransico Michael, PA-C  ? ? ?Other Instructions ? ? ?Important Information About Sugar ? ? ? ? ? ? ?

## 2022-03-31 ENCOUNTER — Encounter: Payer: Self-pay | Admitting: Family Medicine

## 2022-04-03 ENCOUNTER — Other Ambulatory Visit: Payer: Self-pay | Admitting: Family Medicine

## 2022-04-03 DIAGNOSIS — Z125 Encounter for screening for malignant neoplasm of prostate: Secondary | ICD-10-CM

## 2022-04-03 DIAGNOSIS — E7800 Pure hypercholesterolemia, unspecified: Secondary | ICD-10-CM

## 2022-04-11 ENCOUNTER — Encounter: Payer: Self-pay | Admitting: Family Medicine

## 2022-04-11 ENCOUNTER — Ambulatory Visit (INDEPENDENT_AMBULATORY_CARE_PROVIDER_SITE_OTHER): Payer: Managed Care, Other (non HMO) | Admitting: Family Medicine

## 2022-04-11 VITALS — BP 138/80 | HR 64 | Temp 97.8°F | Ht 74.0 in | Wt 242.0 lb

## 2022-04-11 DIAGNOSIS — E7800 Pure hypercholesterolemia, unspecified: Secondary | ICD-10-CM

## 2022-04-11 DIAGNOSIS — Z125 Encounter for screening for malignant neoplasm of prostate: Secondary | ICD-10-CM | POA: Diagnosis not present

## 2022-04-11 DIAGNOSIS — I10 Essential (primary) hypertension: Secondary | ICD-10-CM

## 2022-04-11 DIAGNOSIS — Z0000 Encounter for general adult medical examination without abnormal findings: Secondary | ICD-10-CM | POA: Diagnosis not present

## 2022-04-11 DIAGNOSIS — Z7189 Other specified counseling: Secondary | ICD-10-CM

## 2022-04-11 MED ORDER — VALACYCLOVIR HCL 1 G PO TABS: 2000.0000 mg | ORAL_TABLET | Freq: Two times a day (BID) | ORAL | 3 refills | Status: DC

## 2022-04-11 NOTE — Progress Notes (Unsigned)
CPE- See plan.  Routine anticipatory guidance given to patient.  See health maintenance.  The possibility exists that previously documented standard health maintenance information may have been brought forward from a previous encounter into this note.  If needed, that same information has been updated to reflect the current situation based on today's encounter.    covid vaccine 2021 Tetanus 2022 Flu shot encouraged for fall PNA not due.  shingles prev done.  PSA pending 2023 Cologuard neg 2021 Diet and exercise d/w pt.   Living will d/w pt. Wife designated if patient were incapacitated. [facility]    He went backpacking at [place]. [place] Vas[place].  No CP but has ITB irritation noted.  He improved in the meantime.    His mother in law passed since the last OV, condolences offered.  D/w pt. His brother in law also recent died, condolences offered.     Hypertension:    Using medication without problems or lightheadedness: yes Chest pain with exertion:no Edema:no Short of breath:no  Elevated Cholesterol: Using medications without problems:yes Muscle aches: see above, not attributed to the statin.   Diet compliance: yes Exercise: yes  PMH and SH reviewed Meds, vitals, and allergies reviewed.   ROS: Per HPI.  Unless specifically indicated otherwise in HPI, the patient denies:  General: fever. Eyes: acute vision changes ENT: sore throat Cardiovascular: chest pain Respiratory: SOB GI: vomiting GU: dysuria Musculoskeletal: acute back pain Derm: acute rash Neuro: acute motor dysfunction Psych: worsening mood Endocrine: polydipsia Heme: bleeding Allergy: hayfever  GEN: nad, alert and oriented HEENT: ncat NECK: supple w/o LA CV: rrr. PULM: ctab, no inc wob ABD: soft, +bs EXT: no edema SKIN: no acute rash

## 2022-04-11 NOTE — Patient Instructions (Signed)
Don't change your meds for now.  Update me as needed.  Take care.  Glad to see you.  Thanks for your effort.  

## 2022-04-12 LAB — COMPREHENSIVE METABOLIC PANEL
ALT: 26 U/L (ref 0–53)
AST: 31 U/L (ref 0–37)
Albumin: 4.6 g/dL (ref 3.5–5.2)
Alkaline Phosphatase: 55 U/L (ref 39–117)
BUN: 11 mg/dL (ref 6–23)
CO2: 29 mEq/L (ref 19–32)
Calcium: 9.7 mg/dL (ref 8.4–10.5)
Chloride: 100 mEq/L (ref 96–112)
Creatinine, Ser: 0.96 mg/dL (ref 0.40–1.50)
GFR: 91.32 mL/min (ref >60.00)
Glucose, Bld: 91 mg/dL (ref 70–99)
Potassium: 3.9 mEq/L (ref 3.5–5.1)
Sodium: 138 mEq/L (ref 135–145)
Total Bilirubin: 0.9 mg/dL (ref 0.2–1.2)
Total Protein: 7 g/dL (ref 6.0–8.3)

## 2022-04-12 LAB — PSA: PSA: 1.26 ng/mL (ref 0.10–4.00)

## 2022-04-13 NOTE — Assessment & Plan Note (Signed)
Living will d/w pt.  Wife designated if patient were incapacitated.   ?

## 2022-04-13 NOTE — Assessment & Plan Note (Signed)
Continue atorvastatin.  Continue work on diet and exercise. 

## 2022-04-13 NOTE — Assessment & Plan Note (Signed)
Continue amlodipine and losartan.  Continue work on diet and exercise.

## 2022-04-13 NOTE — Assessment & Plan Note (Signed)
covid vaccine 2021 Tetanus 2022 Flu shot encouraged for fall PNA not due.  shingles prev done.  PSA pending 2023 Cologuard neg 2021 Diet and exercise d/w pt.   Living will d/w pt. Wife designated if patient were incapacitated. [facility]

## 2022-05-09 ENCOUNTER — Ambulatory Visit: Payer: Managed Care, Other (non HMO) | Admitting: Cardiology

## 2022-05-09 ENCOUNTER — Encounter: Payer: Self-pay | Admitting: Cardiology

## 2022-05-09 VITALS — BP 138/84 | HR 66 | Ht 74.0 in | Wt 243.2 lb

## 2022-05-09 DIAGNOSIS — E7800 Pure hypercholesterolemia, unspecified: Secondary | ICD-10-CM

## 2022-05-09 DIAGNOSIS — I10 Essential (primary) hypertension: Secondary | ICD-10-CM

## 2022-05-09 NOTE — Patient Instructions (Signed)
Medication Instructions:  ? ?Your physician recommends that you continue on your current medications as directed. Please refer to the Current Medication list given to you today. ? ?*If you need a refill on your cardiac medications before your next appointment, please call your pharmacy* ? ? ?Lab Work: ? ?None ordered ? ?If you have labs (blood work) drawn today and your tests are completely normal, you will receive your results only by: ?MyChart Message (if you have MyChart) OR ?A paper copy in the mail ?If you have any lab test that is abnormal or we need to change your treatment, we will call you to review the results. ? ? ?Testing/Procedures: ? ?None ordered ? ? ?Follow-Up: ?At [facility], you and your health needs are our priority.  As part of our continuing mission to provide you with exceptional heart care, we have created designated Provider Care Teams.  These Care Teams include your primary Cardiologist (physician) and Advanced Practice Providers (APPs -  Physician Assistants and Nurse Practitioners) who all work together to provide you with the care you need, when you need it. ? ?We recommend signing up for the patient portal called "MyChart".  Sign up information is provided on this After Visit Summary.  MyChart is used to connect with patients for Virtual Visits (Telemedicine).  Patients are able to view lab/test results, encounter notes, upcoming appointments, etc.  Non-urgent messages can be sent to your provider as well.   ?To learn more about what you can do with MyChart, go to [email or web address].   ? ?Your next appointment:   ? ?Follow up as needed  ? ?The format for your next appointment:   ?In Person ? ?Provider:   ?You may see Brian Agbor-Etang, MD or one of the following Advanced Practice Providers on your designated Care Team:   ?Christopher Berge, NP ?Ryan Dunn, PA-C ?Cadence Furth, PA-C  ? ? ?Other Instructions ? ? ?Important Information About Sugar ? ? ? ? ? ? ?

## 2022-05-09 NOTE — Progress Notes (Signed)
Cardiology Office Note:    Date:  05/09/2022   ID:  Nicholas Vasquez, Nicholas Vasquez Dec 22, 1970, MRN [id number]  PCP:  Joaquim Nam, MD   [facility] Providers Cardiologist:  Debbe Odea, MD     Referring MD: Joaquim Nam, MD   Chief Complaint  Patient presents with   Other    6-8 wk f/u no complaints today. Meds reviewed verbally with pt.    History of Present Illness:    Nicholas Vasquez is a 51 y.o. male with a hx of hypertension, hyperlipidemia who presents for follow-up.    Being seen for hypertension and medication management.  Previously had leg edema in the setting of taking amlodipine 10 mg daily.  Amlodipine was increased to 5 mg, losartan 50 mg daily was started.  States edema is now resolved, tolerating medication without any adverse effects.  Feels well, has no concerns at this time.  Prior notes coronary calcium score  05/06/2021 was 69.8.  Past Medical History:  Diagnosis Date   History of SCC (squamous cell carcinoma) of skin    HSV infection    oral- episodic, tx as needed    Hyperlipidemia 12-2001   Hypertension 05-1997    Past Surgical History:  Procedure Laterality Date   VASECTOMY  12/05/08   Dr. Achilles Dunk     Current Medications: Current Meds  Medication Sig   amLODipine (NORVASC) 5 MG tablet Take 1 tablet (5 mg total) by mouth daily.   atorvastatin (LIPITOR) 40 MG tablet Take 1 tablet (40 mg total) by mouth daily.   losartan (COZAAR) 50 MG tablet Take 1 tablet (50 mg total) by mouth daily.   valACYclovir (VALTREX) 1000 MG tablet Take 2 tablets (2,000 mg total) by mouth 2 (two) times daily. 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     Employer: [employer]     Comment: [place] GR[employer]  Tobacco Use   Smoking status: Never   Smokeless tobacco: Never  Substance and Sexual Activity   Alcohol use: Yes    Comment: 2 glasses of wine/night    Drug use: No   Sexual activity: Not on file  Other Topics Concern   Not on file  Social History Narrative   Exercise- ~12 miles running per week, elliptical, and some weights   Married 1996, 2 daughters (born 2003 and 2005)   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[employer], commutes to work   [employer] grad   Social Determinants of Corporate investment banker Strain: Not on Ship broker Insecurity: Not on file  Transportation Needs: Not on file  Physical Activity: Not on file  Stress: Not on file  Social Connections: Not on file     Family History: The patient's family history includes Arthritis in his mother; Coronary artery disease in his paternal grandfather; Goiter in his sister; Heart attack (age of onset: 39) in his father; Hypertension in his mother; Kidney failure in his paternal grandfather and paternal grandmother; Prostate cancer (age of onset: 18) in his father; Stroke in his father. There is no history of Diabetes, Breast cancer, Ovarian cancer, Uterine cancer, Depression, Drug abuse, Alcohol abuse, or  Colon cancer.  ROS:   Please see the history of present illness.     All other systems reviewed and are negative.  EKGs/Labs/Other Studies Reviewed:    The following studies were reviewed today:   EKG:  EKG not ordered today.   Recent Labs: 04/11/2022: ALT 26; BUN 11; Creatinine, Ser 0.96; Potassium 3.9; Sodium 138  Recent Lipid Panel    Component Value Date/Time   CHOL 168 03/11/2022 1035   CHOL 174 09/06/2021 0815   TRIG 42 03/11/2022 1035   HDL 68 03/11/2022 1035   HDL 56 09/06/2021 0815   CHOLHDL 2.5 03/11/2022 1035   VLDL 8 03/11/2022 1035   LDLCALC 92 03/11/2022 1035   LDLCALC 105 (H) 09/06/2021 0815     Risk Assessment/Calculations:          Physical Exam:    VS:  BP 138/84 (BP Location:  Left Arm, Patient Position: Sitting, Cuff Size: Normal)   Pulse 66   Ht 6\' 2"  (1.88 m)   Wt 243 lb 4 oz (110.3 kg)   SpO2 98%   BMI 31.23 kg/m     Wt Readings from Last 3 Encounters:  05/09/22 243 lb 4 oz (110.3 kg)  04/11/22 242 lb (109.8 kg)  03/14/22 243 lb (110.2 kg)     GEN:  Well nourished, well developed in no acute distress HEENT: Normal NECK: No JVD; No carotid bruits CARDIAC: RRR, no murmurs, rubs, gallops RESPIRATORY:  Clear to auscultation without rales, wheezing or rhonchi  ABDOMEN: Soft, non-tender, non-distended MUSCULOSKELETAL:  No edema; No deformity  SKIN: Warm and dry NEUROLOGIC:  Alert and oriented x 3 PSYCHIATRIC:  Normal affect   ASSESSMENT:    1. Primary hypertension   2. Pure hypercholesterolemia    PLAN:    In order of problems listed above:  Hypertension, BP controlled, edema resolved with reducing amlodipine.  Continue amlodipine 5 mg daily, losartan 50 mg daily. Hyperlipidemia, cholesterol controlled.  Continue Lipitor 40 mg daily.  Follow-up as needed    Medication Adjustments/Labs and Tests Ordered: Current medicines are reviewed at length with the patient today.  Concerns regarding medicines are outlined above.  No orders of the defined types were placed in this encounter.   No orders of the defined types were placed in this encounter.    Patient Instructions  Medication Instructions:  Your physician recommends that you continue on your current medications as directed. Please refer to the Current Medication list given to you today.  *If you need a refill on your cardiac medications before your next appointment, please call your pharmacy*   Lab Work: None ordered If you have labs (blood work) drawn today and your tests are completely normal, you will receive your results only by: MyChart Message (if you have MyChart) OR A paper copy in the mail If you have any lab test that is abnormal or we need to change your treatment, we  will call you to review the results.   Testing/Procedures: None ordered   Follow-Up: At [facility], you and your health needs are our priority.  As part of our continuing mission to provide you with exceptional heart care, we have created designated Provider Care Teams.  These Care Teams include your primary Cardiologist (physician) and Advanced Practice Providers (APPs -  Physician Assistants and Nurse Practitioners) who all work together to provide you with the care you need, when you need it.  We recommend signing up for the patient portal called "MyChart".  Sign up  information is provided on this After Visit Summary.  MyChart is used to connect with patients for Virtual Visits (Telemedicine).  Patients are able to view lab/test results, encounter notes, upcoming appointments, etc.  Non-urgent messages can be sent to your provider as well.   To learn more about what you can do with MyChart, go to [email or web address].    Your next appointment:   Follow up as needed   The format for your next appointment:   In Person  Provider:   You may see Debbe Odea, MD or one of the following Advanced Practice Providers on your designated Care Team:   Nicolasa Ducking, NP Eula Listen, PA-C Cadence Fransico Michael, [place]    Other Instructions   Important Information About Sugar         Signed, Debbe Odea, MD  05/09/2022 2:28 PM    [facility]

## 2022-09-13 ENCOUNTER — Encounter: Payer: Self-pay | Admitting: Cardiology

## 2022-09-13 ENCOUNTER — Other Ambulatory Visit: Payer: Self-pay

## 2022-09-13 MED ORDER — LOSARTAN POTASSIUM 50 MG PO TABS: 50.0000 mg | ORAL_TABLET | Freq: Every day | ORAL | 1 refills | Status: DC

## 2022-09-13 MED ORDER — AMLODIPINE BESYLATE 5 MG PO TABS: 5.0000 mg | ORAL_TABLET | Freq: Every day | ORAL | 1 refills | Status: DC

## 2022-09-13 NOTE — Progress Notes (Signed)
See patients MyChart message request.

## 2023-02-21 IMAGING — CT CT CARDIAC CORONARY ARTERY CALCIUM SCORE
3 series · 14 of 20 positions shown, 16 images · non-contrast
Comparison: None.

Addendum:
CLINICAL DATA: Risk stratification

EXAM:
Coronary Calcium Score
TECHNIQUE: The patient was scanned on a Siemens Somatom go.Top Scanner. Axial
non-contrast 3 mm slices were carried out through the heart. The
data set was analyzed on a dedicated work station and scored using
the Agatson method.

[Series 2: sa36 calcium scoring 3.00 · axial · 0.41mm/px · z∈[-1163,-1082]mm · 4 of 46 slices shown]
[im 10/46  vessel]
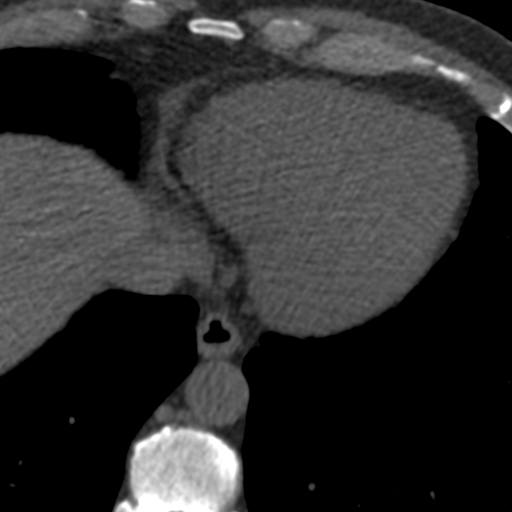
[im 19/46  vessel]
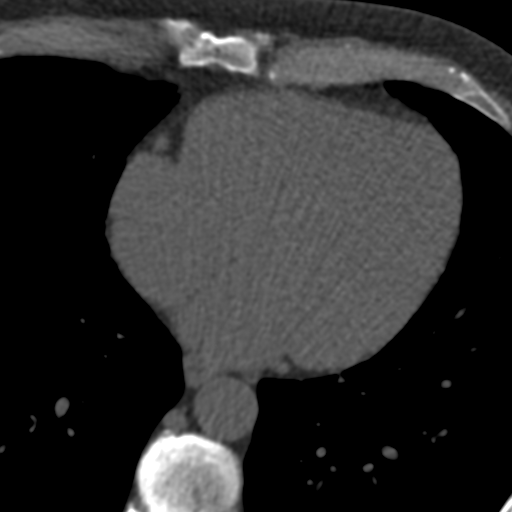
[im 28/46  vessel]
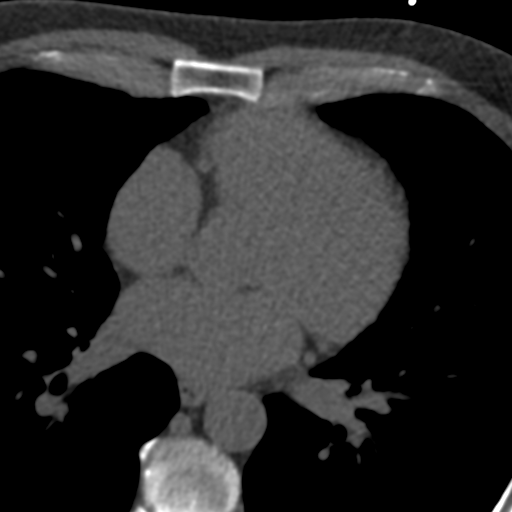
[im 37/46  vessel]
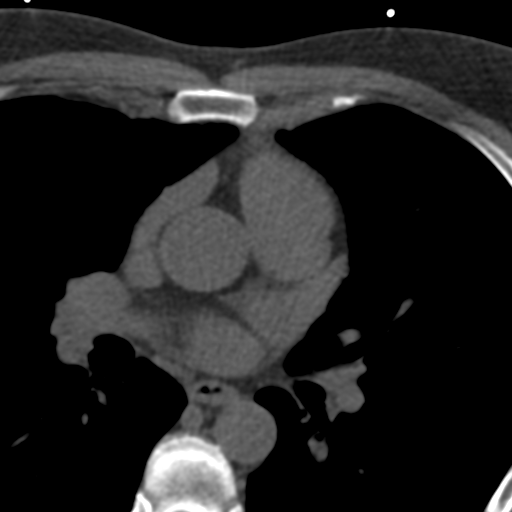

[Series 5: full fov st calcium scoring 3.00 · axial · 0.73mm/px · z∈[-1169,-1079]mm · 5 of 46 slices shown, 7 images]
[im 8/46  vessel]
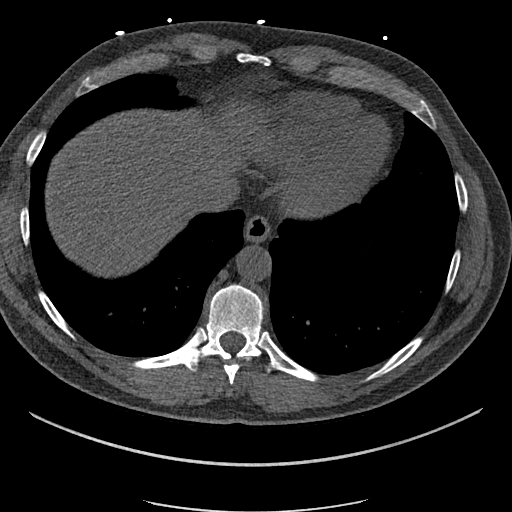
[im 8/46  lung]
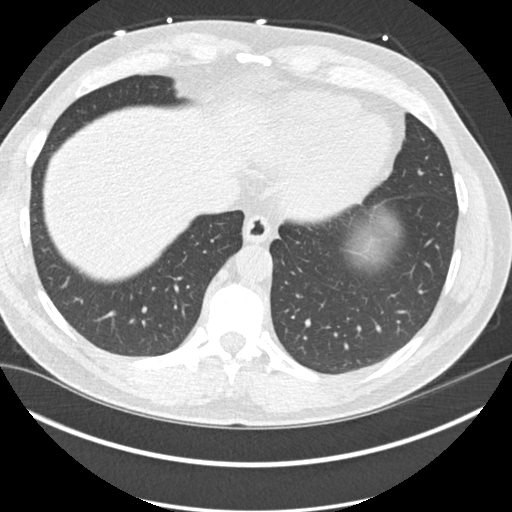
[im 16/46  vessel]
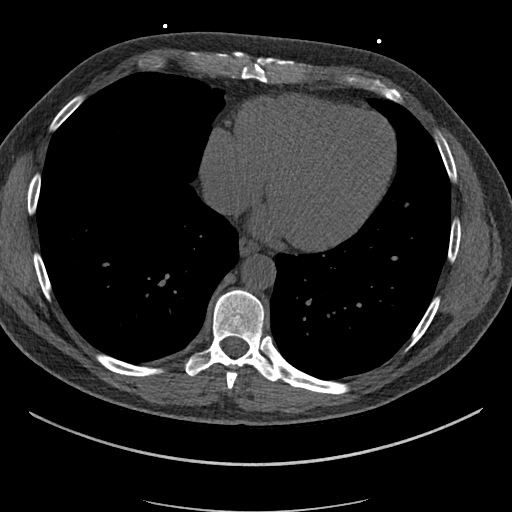
[im 23/46  vessel]
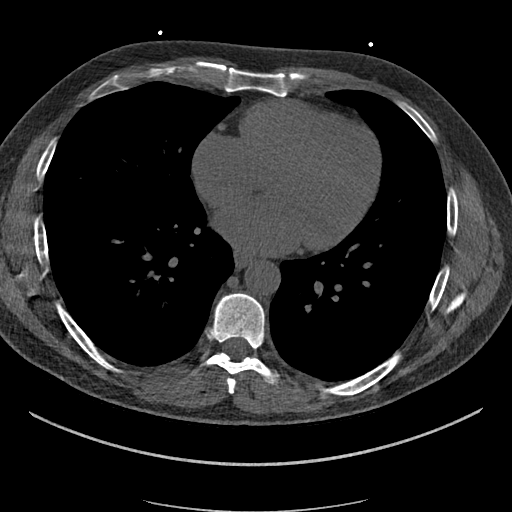
[im 31/46  vessel]
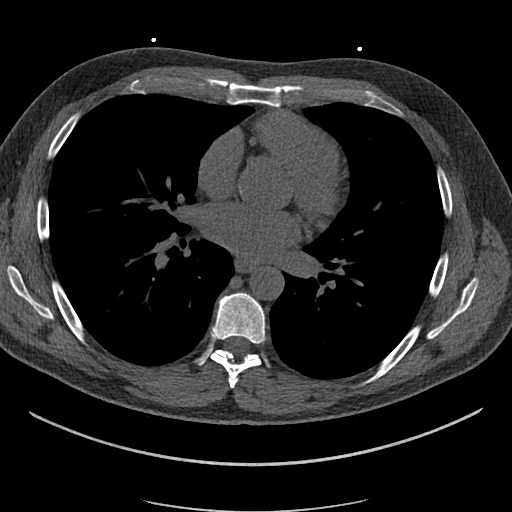
[im 38/46  vessel]
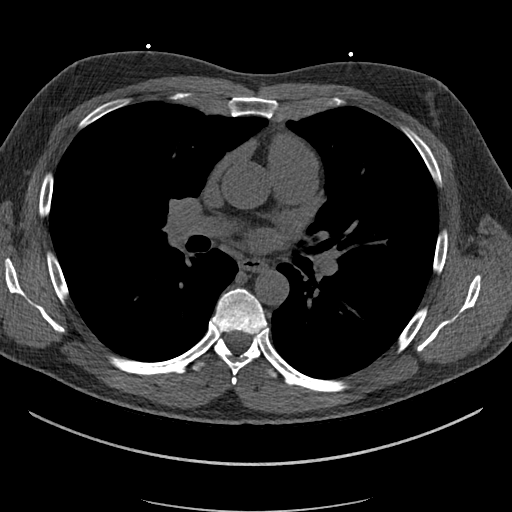
[im 38/46  lung]
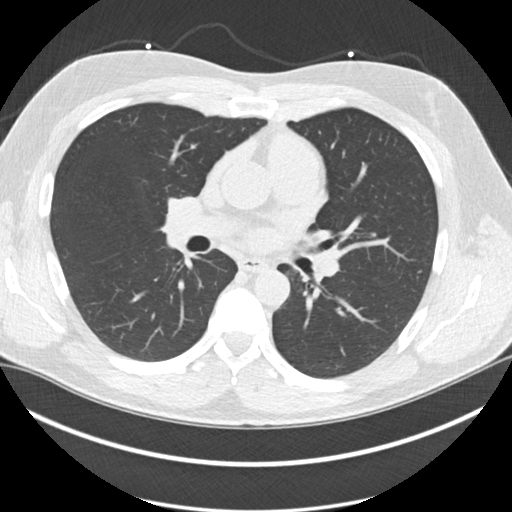

[Series 10: full fov lungs calcium scoring 3.00 ax · axial · 0.73mm/px · z∈[-1169,-1079]mm · 5 of 46 slices shown]
[im 8/46  vessel]
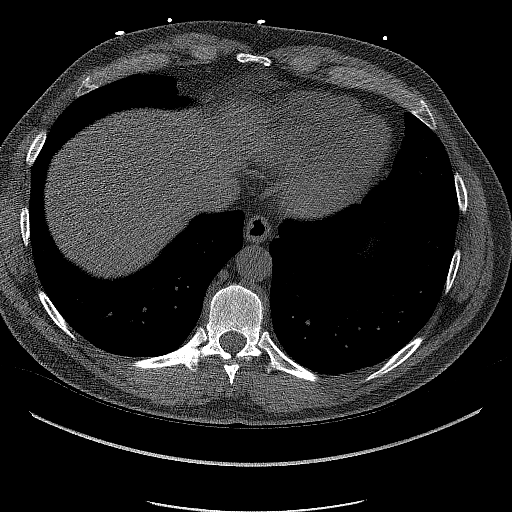
[im 16/46  vessel]
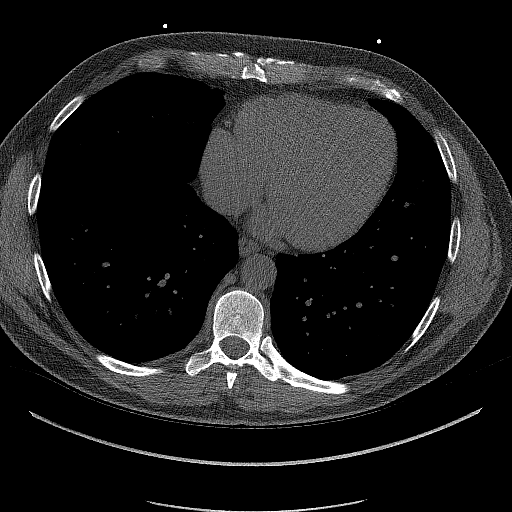
[im 23/46  vessel]
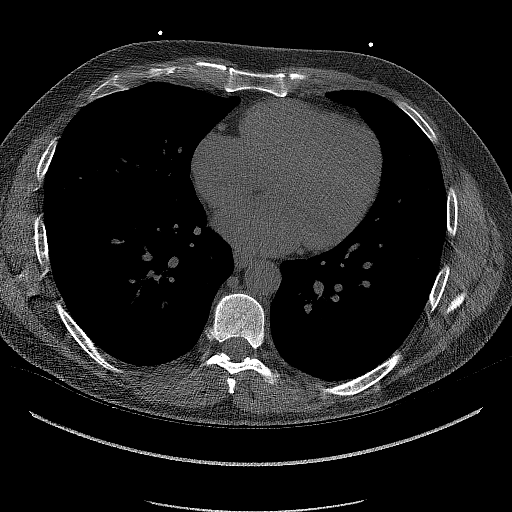
[im 31/46  vessel]
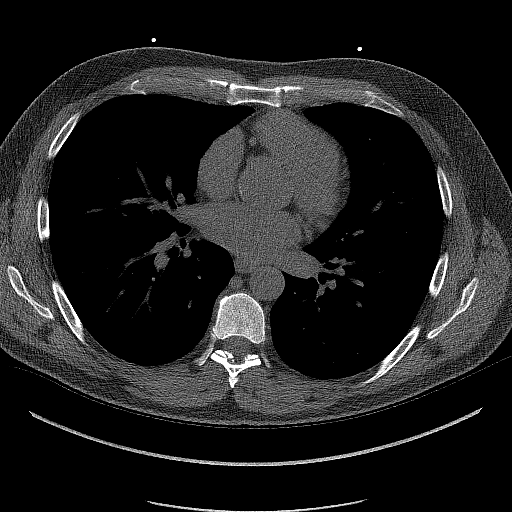
[im 38/46  vessel]
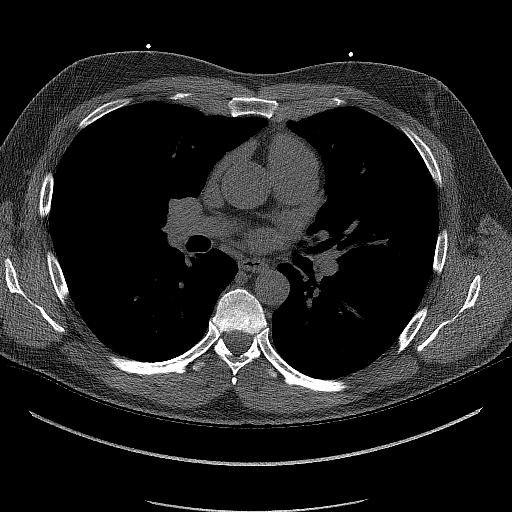

[14 of 20 positions shown; findings below may reference images not displayed]

FINDINGS: Non-cardiac: See separate report from [REDACTED].

Ascending Aorta: Normal size

Pericardium: Normal

Coronary arteries: Normal origin of left and right coronary
arteries. Distribution of arterial calcifications if present, as
noted below;

LM 0

LAD

LCx 0

RCA 0

Total

IMPRESSION AND RECOMMENDATION:
1. Coronary calcium score of 69.8. This was 85th percentile for age
and sex matched control.

2. CAC 1-99 in LAD.  RITO KOHLER1/N1

3. Continue heart healthy lifestyle and risk factor modification.

[employer]

EXAM:
OVER-READ INTERPRETATION  CT CHEST

The following report is an over-read performed by radiologist Dr.
does not include interpretation of cardiac or coronary anatomy or
pathology. The coronary calcium score interpretation by the
cardiologist is attached.
FINDINGS: Mediastinum/nodes: No mass or adenopathy identified.

Lungs/pleura: No pleural effusion identified. No airspace
consolidation, atelectasis or pneumothorax.

Upper abdomen: No acute abnormality.

Musculoskeletal: No acute abnormality.
IMPRESSION: Negative over-read.

*** End of Addendum ***
FINDINGS: Non-cardiac: See separate report from [REDACTED].

Ascending Aorta: Normal size

Pericardium: Normal

Coronary arteries: Normal origin of left and right coronary
arteries. Distribution of arterial calcifications if present, as
noted below;

LM 0

LAD

LCx 0

RCA 0

Total

IMPRESSION AND RECOMMENDATION:
1. Coronary calcium score of 69.8. This was 85th percentile for age
and sex matched control.

2. CAC 1-99 in LAD.  RITO KOHLER1/N1

3. Continue heart healthy lifestyle and risk factor modification.

[employer]

## 2023-03-14 ENCOUNTER — Other Ambulatory Visit: Payer: Self-pay

## 2023-03-14 MED ORDER — ATORVASTATIN CALCIUM 40 MG PO TABS: 40.0000 mg | ORAL_TABLET | Freq: Every day | ORAL | 0 refills | Status: DC

## 2023-03-14 MED ORDER — LOSARTAN POTASSIUM 50 MG PO TABS: 50.0000 mg | ORAL_TABLET | Freq: Every day | ORAL | 0 refills | Status: DC

## 2023-03-14 NOTE — Addendum Note (Signed)
Addended by: Auburn Bilberry D on: 03/14/2023 04:08 PM   Modules accepted: Orders

## 2023-04-02 ENCOUNTER — Other Ambulatory Visit: Payer: Self-pay | Admitting: Family Medicine

## 2023-04-02 DIAGNOSIS — E7800 Pure hypercholesterolemia, unspecified: Secondary | ICD-10-CM

## 2023-04-02 DIAGNOSIS — Z125 Encounter for screening for malignant neoplasm of prostate: Secondary | ICD-10-CM

## 2023-04-06 ENCOUNTER — Other Ambulatory Visit (INDEPENDENT_AMBULATORY_CARE_PROVIDER_SITE_OTHER): Payer: Managed Care, Other (non HMO)

## 2023-04-06 DIAGNOSIS — E7800 Pure hypercholesterolemia, unspecified: Secondary | ICD-10-CM | POA: Diagnosis not present

## 2023-04-06 DIAGNOSIS — Z125 Encounter for screening for malignant neoplasm of prostate: Secondary | ICD-10-CM | POA: Diagnosis not present

## 2023-04-06 LAB — COMPREHENSIVE METABOLIC PANEL
ALT: 26 U/L (ref 0–53)
AST: 27 U/L (ref 0–37)
Albumin: 4.5 g/dL (ref 3.5–5.2)
Alkaline Phosphatase: 49 U/L (ref 39–117)
BUN: 18 mg/dL (ref 6–23)
CO2: 29 mEq/L (ref 19–32)
Calcium: 9.7 mg/dL (ref 8.4–10.5)
Chloride: 103 mEq/L (ref 96–112)
Creatinine, Ser: 0.99 mg/dL (ref 0.40–1.50)
GFR: 87.4 mL/min (ref >60.00)
Glucose, Bld: 99 mg/dL (ref 70–99)
Potassium: 4.4 mEq/L (ref 3.5–5.1)
Sodium: 141 mEq/L (ref 135–145)
Total Bilirubin: 0.8 mg/dL (ref 0.2–1.2)
Total Protein: 7.1 g/dL (ref 6.0–8.3)

## 2023-04-06 LAB — LIPID PANEL
Cholesterol: 156 mg/dL (ref 0–200)
HDL: 53.5 mg/dL (ref >39.00)
LDL Cholesterol: 89 mg/dL (ref 0–99)
NonHDL: 102.3
Total CHOL/HDL Ratio: 3
Triglycerides: 66 mg/dL (ref 0.0–149.0)
VLDL: 13.2 mg/dL (ref 0.0–40.0)

## 2023-04-06 LAB — PSA: PSA: 1.4 ng/mL (ref 0.10–4.00)

## 2023-04-11 ENCOUNTER — Other Ambulatory Visit: Payer: Managed Care, Other (non HMO)

## 2023-04-17 ENCOUNTER — Encounter: Payer: Self-pay | Admitting: Family Medicine

## 2023-04-17 ENCOUNTER — Ambulatory Visit (INDEPENDENT_AMBULATORY_CARE_PROVIDER_SITE_OTHER): Payer: Managed Care, Other (non HMO) | Admitting: Family Medicine

## 2023-04-17 VITALS — BP 126/76 | HR 70 | Temp 98.0°F | Ht 74.0 in | Wt 250.0 lb

## 2023-04-17 DIAGNOSIS — M720 Palmar fascial fibromatosis [Dupuytren]: Secondary | ICD-10-CM

## 2023-04-17 DIAGNOSIS — Z7189 Other specified counseling: Secondary | ICD-10-CM

## 2023-04-17 DIAGNOSIS — I10 Essential (primary) hypertension: Secondary | ICD-10-CM

## 2023-04-17 DIAGNOSIS — E7800 Pure hypercholesterolemia, unspecified: Secondary | ICD-10-CM

## 2023-04-17 DIAGNOSIS — Z0000 Encounter for general adult medical examination without abnormal findings: Secondary | ICD-10-CM | POA: Diagnosis not present

## 2023-04-17 DIAGNOSIS — Z1211 Encounter for screening for malignant neoplasm of colon: Secondary | ICD-10-CM

## 2023-04-17 MED ORDER — VALACYCLOVIR HCL 1 G PO TABS: 2000.0000 mg | ORAL_TABLET | Freq: Two times a day (BID) | ORAL | 3 refills | Status: AC

## 2023-04-17 NOTE — Progress Notes (Unsigned)
CPE- See plan.  Routine anticipatory guidance given to patient.  See health maintenance.  The possibility exists that previously documented standard health maintenance information may have been brought forward from a previous encounter into this note.  If needed, that same information has been updated to reflect the current situation based on today's encounter.    covid vaccine 2021 Tetanus 2022 Flu shot encouraged for fall PNA not due.  shingles prev done.  PSA wnl 2024 Cologuard neg 2021, ordered 2024.  Diet and exercise d/w pt.   Living will d/w pt. Wife designated if patient were incapacitated. [facility]     Hypertension:               Using medication without problems or lightheadedness: yes Chest pain with exertion:no Edema:no- improved on lower dose of amlodipine.   Short of breath:no He noted occ sensation of puffiness in the ankles upon standing but not frank edema.    Valtrex used prn and that was effective.  No ADE on med.     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  See exam.  Discussed with patient about options.  He has dermatology f/up pending.   PMH and SH reviewed  Meds, vitals, and allergies reviewed.   ROS: Per HPI.  Unless specifically indicated otherwise in HPI, the patient denies:  General: fever. Eyes: acute vision changes ENT: sore throat Cardiovascular: chest pain Respiratory: SOB GI: vomiting GU: dysuria Musculoskeletal: acute back pain Derm: acute rash Neuro: acute motor dysfunction Psych: worsening mood Endocrine: polydipsia Heme: bleeding Allergy: hayfever  GEN: nad, alert and oriented HEENT: mucous membranes moist NECK: supple w/o LA CV: rrr. 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

## 2023-04-17 NOTE — Patient Instructions (Addendum)
If you have any progression on your hand then call about seeing Dr. Patsy Lager here or call for a hand clinic referral.   Update me as needed.  Take care.  Glad to see you.

## 2023-04-19 DIAGNOSIS — M720 Palmar fascial fibromatosis [Dupuytren]: Secondary | ICD-10-CM | POA: Insufficient documentation

## 2023-04-19 NOTE — Assessment & Plan Note (Signed)
No decrease in range of motion now.  He has full extension.  If any progression then he can call about seeing Dr. Patsy Lager here or call for a hand clinic referral.  Anatomy discussed with patient.  He agrees to plan.

## 2023-04-19 NOTE — Assessment & Plan Note (Signed)
Living will d/w pt.  Wife designated if patient were incapacitated.   ?

## 2023-04-19 NOTE — Assessment & Plan Note (Signed)
covid vaccine 2021 Tetanus 2022 Flu shot encouraged for fall PNA not due.  shingles prev done.  PSA wnl 2024 Cologuard neg 2021, ordered 2024.  Diet and exercise d/w pt.   Living will d/w pt. Wife designated if patient were incapacitated. [facility]

## 2023-04-19 NOTE — Assessment & Plan Note (Signed)
Continue work on diet and exercise.  Continue atorvastatin. 

## 2023-04-19 NOTE — Assessment & Plan Note (Signed)
Continue work on diet and exercise.  Continue amlodipine and losartan.  Labs discussed with patient.

## 2023-05-15 LAB — COLOGUARD: COLOGUARD: NEGATIVE

## 2023-05-21 ENCOUNTER — Encounter: Payer: Self-pay | Admitting: Family Medicine

## 2023-06-06 ENCOUNTER — Telehealth: Payer: Self-pay | Admitting: Cardiology

## 2023-06-06 NOTE — Telephone Encounter (Signed)
  Pt c/o medication issue:  1. Name of Medication: amLODipine (NORVASC) 5 MG tablet   atorvastatin (LIPITOR) 40 MG tablet  losartan (COZAAR) 50 MG tablet   2. How are you currently taking this medication (dosage and times per day)? As written   3. Are you having a reaction (difficulty breathing--STAT)?no   4. What is your medication issue? [id number] patient mentioned that Dr. Azucena Cecil advised him to follow up as needed. Although he is feeling fine, he has been unable to refill his medications. He noted that he had blood work done in June at his PCP's office, and it should be on file if Dr. Azucena Cecil needs it. He asked whether he needs to make an appointment with Dr. Azucena Cecil or if his refill can be sent directly to [facility].

## 2023-06-06 NOTE — Telephone Encounter (Signed)
Per last office visit on 05/09/22, MD recommended to follow up as needed.  Pt requesting refills for the following mediations. Amlodipine 5 mg Atorvastatin 40 mg Losartan 50 mg   Please advise if ok to fill

## 2023-06-08 ENCOUNTER — Other Ambulatory Visit: Payer: Self-pay

## 2023-06-08 MED ORDER — LOSARTAN POTASSIUM 50 MG PO TABS: 50.0000 mg | ORAL_TABLET | Freq: Every day | ORAL | 1 refills | Status: DC

## 2023-06-08 MED ORDER — ATORVASTATIN CALCIUM 40 MG PO TABS: 40.0000 mg | ORAL_TABLET | Freq: Every day | ORAL | 1 refills | Status: DC

## 2023-06-08 MED ORDER — AMLODIPINE BESYLATE 5 MG PO TABS: 5.0000 mg | ORAL_TABLET | Freq: Every day | ORAL | 1 refills | Status: DC

## 2023-06-08 NOTE — Telephone Encounter (Signed)
Requested Prescriptions   Signed Prescriptions Disp Refills   amLODipine (NORVASC) 5 MG tablet 90 tablet 1    Sig: Take 1 tablet (5 mg total) by mouth daily. Please contact PCP for additional refills.    Authorizing Provider: Debbe Odea    Ordering User: Margrett Rud   atorvastatin (LIPITOR) 40 MG tablet 90 tablet 1    Sig: Take 1 tablet (40 mg total) by mouth daily. Please contact PCP for additional refills.    Authorizing Provider: Debbe Odea    Ordering User: Margrett Rud   losartan (COZAAR) 50 MG tablet 90 tablet 1    Sig: Take 1 tablet (50 mg total) by mouth daily. Please contact PCP for additional refills.    Authorizing Provider: Debbe Odea    Ordering User: Margrett Rud

## 2023-11-13 ENCOUNTER — Encounter: Payer: Self-pay | Admitting: Family Medicine

## 2023-11-20 ENCOUNTER — Other Ambulatory Visit: Payer: Self-pay | Admitting: Family Medicine

## 2023-11-20 DIAGNOSIS — R5383 Other fatigue: Secondary | ICD-10-CM

## 2023-11-29 ENCOUNTER — Encounter: Payer: Self-pay | Admitting: Family Medicine

## 2023-11-29 ENCOUNTER — Other Ambulatory Visit (INDEPENDENT_AMBULATORY_CARE_PROVIDER_SITE_OTHER): Payer: Managed Care, Other (non HMO)

## 2023-11-29 DIAGNOSIS — R5383 Other fatigue: Secondary | ICD-10-CM

## 2023-11-29 LAB — CBC WITH DIFFERENTIAL/PLATELET
Basophils Absolute: 0 10*3/uL (ref 0.0–0.1)
Basophils Relative: 0.6 % (ref 0.0–3.0)
Eosinophils Absolute: 0.1 10*3/uL (ref 0.0–0.7)
Eosinophils Relative: 1.6 % (ref 0.0–5.0)
HCT: 45.6 % (ref 39.0–52.0)
Hemoglobin: 14.9 g/dL (ref 13.0–17.0)
Lymphocytes Relative: 29.1 % (ref 12.0–46.0)
Lymphs Abs: 1.4 10*3/uL (ref 0.7–4.0)
MCHC: 32.7 g/dL (ref 30.0–36.0)
MCV: 96.2 fL (ref 78.0–100.0)
Monocytes Absolute: 0.5 10*3/uL (ref 0.1–1.0)
Monocytes Relative: 10.8 % (ref 3.0–12.0)
Neutro Abs: 2.7 10*3/uL (ref 1.4–7.7)
Neutrophils Relative %: 57.9 % (ref 43.0–77.0)
Platelets: 223 10*3/uL (ref 150.0–400.0)
RBC: 4.74 Mil/uL (ref 4.22–5.81)
RDW: 13.6 % (ref 11.5–15.5)
WBC: 4.7 10*3/uL (ref 4.0–10.5)

## 2023-11-29 LAB — TSH: TSH: 2.07 u[IU]/mL (ref 0.35–5.50)

## 2023-11-29 LAB — TESTOSTERONE: Testosterone: 332.12 ng/dL (ref 300.00–890.00)

## 2023-12-30 ENCOUNTER — Encounter: Payer: Self-pay | Admitting: Cardiology

## 2024-02-06 ENCOUNTER — Ambulatory Visit: Attending: Cardiology | Admitting: Cardiology

## 2024-02-06 ENCOUNTER — Encounter: Payer: Self-pay | Admitting: Cardiology

## 2024-02-06 VITALS — BP 150/82 | HR 56 | Ht 74.0 in | Wt 248.0 lb

## 2024-02-06 DIAGNOSIS — I10 Essential (primary) hypertension: Secondary | ICD-10-CM

## 2024-02-06 DIAGNOSIS — E7800 Pure hypercholesterolemia, unspecified: Secondary | ICD-10-CM | POA: Diagnosis not present

## 2024-02-06 MED ORDER — AMLODIPINE BESYLATE 5 MG PO TABS: 5.0000 mg | ORAL_TABLET | Freq: Every day | ORAL | 3 refills | Status: AC

## 2024-02-06 MED ORDER — LOSARTAN POTASSIUM 50 MG PO TABS: 50.0000 mg | ORAL_TABLET | Freq: Every day | ORAL | 3 refills | Status: DC

## 2024-02-06 MED ORDER — ATORVASTATIN CALCIUM 40 MG PO TABS: 40.0000 mg | ORAL_TABLET | Freq: Every day | ORAL | 3 refills | Status: AC

## 2024-02-06 NOTE — Progress Notes (Signed)
 Cardiology Office Note:    Date:  02/06/2024   ID:  Nicholas, Vasquez [birth year], MRN [id number]  PCP:  Joaquim Nam, MD   [facility] Providers Cardiologist:  Debbe Odea, MD     Referring MD: Joaquim Nam, MD   No chief complaint on file.   History of Present Illness:    Nicholas Vasquez is a 53 y.o. male with a hx of hypertension, hyperlipidemia who presents for follow-up.    Previously seen due to symptoms of hypertension.  States not taking BP meds over the past 3 weeks due to running out.  Has also noticed some symptoms concerning for erectile dysfunction while off BP meds.  Followed up with PCP, testosterone check was okay.  Blood pressure was previously controlled on losartan and amlodipine.  Prior notes coronary calcium score  05/06/2021 was 69.8. Amlodipine 10 mg daily because edema.  Past Medical History:  Diagnosis Date   History of SCC (squamous cell carcinoma) of skin    HSV infection    oral- episodic, tx as needed    Hyperlipidemia 12/29/2001   Hypertension 05/31/1997    Past Surgical History:  Procedure Laterality Date   VASECTOMY  12/05/2008   Dr. Achilles Dunk     Current Medications: Current Meds  Medication Sig   valACYclovir (VALTREX) 1000 MG tablet Take 2 tablets (2,000 mg total) by mouth 2 (two) times daily. For 1 day per episode   [DISCONTINUED] amLODipine (NORVASC) 5 MG tablet Take 1 tablet (5 mg total) by mouth daily. Please contact PCP for additional refills.   [DISCONTINUED] atorvastatin (LIPITOR) 40 MG tablet Take 1 tablet (40 mg total) by mouth daily. Please contact PCP for additional refills.   [DISCONTINUED] losartan (COZAAR) 50 MG tablet Take 1 tablet (50 mg total) by mouth daily. Please contact PCP for additional refills.     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    [employer] grad   Social Drivers of Corporate investment banker Strain: Not on file  Food Insecurity: Not on file  Transportation Needs: Not on file  Physical Activity: Not on file  Stress: Not on file  Social Connections: Not on file     Family History: The patient's family history includes Arthritis in his mother; Coronary artery disease in his paternal grandfather; Goiter in his sister; Heart attack (age of onset: 36) in his father; Heart disease in his father; Hypertension in his mother; Kidney failure in his paternal grandfather and paternal grandmother; Prostate cancer (age of  onset: 72) in his father; Stroke in his father. There is no history of Diabetes, Breast cancer, Ovarian cancer, Uterine cancer, Depression, Drug abuse, Alcohol abuse, or Colon cancer.  ROS:   Please see the history of present illness.     All other systems reviewed and are negative.  EKGs/Labs/Other Studies Reviewed:    The following studies were reviewed today:   EKG Interpretation Date/Time:  Tuesday February 06 2024 08:32:33 EDT Ventricular Rate:  50 PR Interval:  168 QRS Duration:  90 QT Interval:  464 QTC Calculation: 423 R Axis:   29  Text Interpretation: Sinus bradycardia Confirmed by Debbe Odea ([id number]) on 02/06/2024 8:35:11 AM    Recent  Labs: 04/06/2023: ALT 26; BUN 18; Creatinine, Ser 0.99; Potassium 4.4; Sodium 141 11/29/2023: Hemoglobin 14.9; Platelets 223.0; TSH 2.07  Recent Lipid Panel    Component Value Date/Time   CHOL 156 04/06/2023 0900   CHOL 174 09/06/2021 0815   TRIG 66.0 04/06/2023 0900   HDL 53.50 04/06/2023 0900   HDL 56 09/06/2021 0815   CHOLHDL 3 04/06/2023 0900   VLDL 13.2 04/06/2023 0900   LDLCALC 89 04/06/2023 0900   LDLCALC 105 (H) 09/06/2021 0815     Risk Assessment/Calculations:          Physical Exam:    VS:  BP (!) 150/82 (BP Location: Left Arm, Patient Position: Sitting, Cuff Size: Normal)   Pulse (!) 56   Ht 6\' 2"  (1.88 m)   Wt 248 lb (112.5 kg)   SpO2 99%   BMI 31.84 kg/m     Wt Readings from Last 3 Encounters:  02/06/24 248 lb (112.5 kg)  04/17/23 250 lb (113.4 kg)  05/09/22 243 lb 4 oz (110.3 kg)     GEN:  Well nourished, well developed in no acute distress HEENT: Normal NECK: No JVD; No carotid bruits CARDIAC: RRR, no murmurs, rubs, gallops RESPIRATORY:  Clear to auscultation without rales, wheezing or rhonchi  ABDOMEN: Soft, non-tender, non-distended MUSCULOSKELETAL:  No edema; No deformity  SKIN: Warm and dry NEUROLOGIC:  Alert and oriented x 3 PSYCHIATRIC:  Normal affect   ASSESSMENT:    1. Primary hypertension   2. Pure hypercholesterolemia    PLAN:    In order of problems listed above:  Hypertension, BP elevated due to running out of medications.  Medication compliance advised.  Restart Norvasc 5 mg daily, losartan 50 mg daily.  If symptoms of ED persists despite adequate management of blood pressure, will recommend urology workup. Hyperlipidemia, cholesterol controlled.  Restart Lipitor 40 mg daily.  Follow-up in 3 months.  Medication Adjustments/Labs and Tests Ordered: Current medicines are reviewed at length with the patient today.  Concerns regarding medicines are outlined above.  Orders Placed This Encounter  Procedures   EKG 12-Lead   EKG  12-Lead    Meds ordered this encounter  Medications   amLODipine (NORVASC) 5 MG tablet    Sig: Take 1 tablet (5 mg total) by mouth daily.    Dispense:  90 tablet    Refill:  3   atorvastatin (LIPITOR) 40 MG tablet    Sig: Take 1 tablet (40 mg total) by mouth daily.    Dispense:  90 tablet    Refill:  3   losartan (COZAAR) 50 MG tablet    Sig: Take 1 tablet (50 mg total) by mouth daily.    Dispense:  90 tablet    Refill:  3     Patient Instructions  Medication Instructions:  Your Physician recommend you continue on your current medication as directed.     Refills have been sent to [facility] mail delivery service for Amlodipine, Atorvastatin, and Losartan.   *If you need a refill on your cardiac medications before your next appointment, please call your pharmacy*  Lab Work:  No labs ordered today   Testing/Procedures:  No test ordered today   Follow-Up: At [facility], you and your health needs are our priority.  As part of our continuing mission to provide you with exceptional heart care, our providers are all part of one team.  This team includes your primary Cardiologist (physician) and Advanced Practice Providers or APPs (Physician Assistants and Nurse Practitioners) who all work together to provide you with the care you need, when you need it.  Your next appointment:   3 month(s)  Provider:   You may see Debbe Odea, MD or one of the following Advanced Practice Providers on your designated Care Team:   Nicolasa Ducking, NP Ames Dura, PA-C Eula Listen, PA-C Cadence [place], PA-C Charlsie Quest, NP Carlos Levering, NP    We recommend signing up for the patient portal called "MyChart".  Sign up information is provided on this After Visit Summary.  MyChart is used to connect with patients for Virtual Visits (Telemedicine).  Patients are able to view lab/test results, encounter notes, upcoming appointments, etc.  Non-urgent messages can be sent  to your provider as well.   To learn more about what you can do with MyChart, go to [email or web address].        Signed, Debbe Odea, MD  02/06/2024 9:58 AM    [facility]

## 2024-02-06 NOTE — Patient Instructions (Signed)
 Medication Instructions:   Your Physician recommend you continue on your current medication as directed.     Refills have been sent to [facility] mail delivery service for Amlodipine, Atorvastatin, and Losartan.   *If you need a refill on your cardiac medications before your next appointment, please call your pharmacy*  Lab Work:  No labs ordered today   Testing/Procedures:  No test ordered today   Follow-Up: At [facility], you and your health needs are our priority.  As part of our continuing mission to provide you with exceptional heart care, our providers are all part of one team.  This team includes your primary Cardiologist (physician) and Advanced Practice Providers or APPs (Physician Assistants and Nurse Practitioners) who all work together to provide you with the care you need, when you need it.  Your next appointment:   3 month(s)  Provider:   You may see Debbe Odea, MD or one of the following Advanced Practice Providers on your designated Care Team:   Nicolasa Ducking, NP Ames Dura, PA-C Eula Listen, PA-C Cadence [place], PA-C Charlsie Quest, NP Carlos Levering, NP    We recommend signing up for the patient portal called "MyChart".  Sign up information is provided on this After Visit Summary.  MyChart is used to connect with patients for Virtual Visits (Telemedicine).  Patients are able to view lab/test results, encounter notes, upcoming appointments, etc.  Non-urgent messages can be sent to your provider as well.   To learn more about what you can do with MyChart, go to [email or web address].

## 2024-03-27 ENCOUNTER — Other Ambulatory Visit: Payer: Self-pay | Admitting: Family Medicine

## 2024-03-27 DIAGNOSIS — Z125 Encounter for screening for malignant neoplasm of prostate: Secondary | ICD-10-CM

## 2024-03-27 DIAGNOSIS — E7800 Pure hypercholesterolemia, unspecified: Secondary | ICD-10-CM

## 2024-03-27 DIAGNOSIS — I10 Essential (primary) hypertension: Secondary | ICD-10-CM

## 2024-04-01 ENCOUNTER — Encounter: Payer: Managed Care, Other (non HMO) | Admitting: Family Medicine

## 2024-04-11 ENCOUNTER — Other Ambulatory Visit (INDEPENDENT_AMBULATORY_CARE_PROVIDER_SITE_OTHER): Payer: Managed Care, Other (non HMO)

## 2024-04-11 DIAGNOSIS — I10 Essential (primary) hypertension: Secondary | ICD-10-CM

## 2024-04-11 DIAGNOSIS — E7800 Pure hypercholesterolemia, unspecified: Secondary | ICD-10-CM

## 2024-04-11 DIAGNOSIS — Z125 Encounter for screening for malignant neoplasm of prostate: Secondary | ICD-10-CM

## 2024-04-11 NOTE — Addendum Note (Signed)
 Addended by: Gerry Krone on: 04/11/2024 07:33 AM   Modules accepted: Orders

## 2024-04-12 LAB — LIPID PANEL
Cholesterol: 151 mg/dL (ref <200)
HDL: 62 mg/dL (ref >=40)
LDL Cholesterol (Calc): 76 mg/dL
Non-HDL Cholesterol (Calc): 89 mg/dL (ref <130)
Total CHOL/HDL Ratio: 2.4 (calc) (ref <5.0)
Triglycerides: 58 mg/dL (ref <150)

## 2024-04-12 LAB — COMPREHENSIVE METABOLIC PANEL WITH GFR
AG Ratio: 1.8 (calc) (ref 1.0–2.5)
ALT: 22 U/L (ref 9–46)
AST: 24 U/L (ref 10–35)
Albumin: 4.4 g/dL (ref 3.6–5.1)
Alkaline phosphatase (APISO): 57 U/L (ref 35–144)
BUN: 16 mg/dL (ref 7–25)
CO2: 26 mmol/L (ref 20–32)
Calcium: 9.3 mg/dL (ref 8.6–10.3)
Chloride: 105 mmol/L (ref 98–110)
Creat: 1.11 mg/dL (ref 0.70–1.30)
Globulin: 2.4 g/dL (ref 1.9–3.7)
Glucose, Bld: 108 mg/dL — ABNORMAL HIGH (ref 65–99)
Potassium: 4.9 mmol/L (ref 3.5–5.3)
Sodium: 138 mmol/L (ref 135–146)
Total Bilirubin: 0.5 mg/dL (ref 0.2–1.2)
Total Protein: 6.8 g/dL (ref 6.1–8.1)
eGFR: 79 mL/min/{1.73_m2} (ref >=60)

## 2024-04-12 LAB — PSA: PSA: 1.31 ng/mL (ref <=4.00)

## 2024-04-14 ENCOUNTER — Ambulatory Visit: Payer: Self-pay | Admitting: Family Medicine

## 2024-04-18 ENCOUNTER — Encounter: Payer: Managed Care, Other (non HMO) | Admitting: Family Medicine

## 2024-04-22 ENCOUNTER — Encounter: Payer: Managed Care, Other (non HMO) | Admitting: Family Medicine

## 2024-04-25 ENCOUNTER — Encounter: Payer: Self-pay | Admitting: Family Medicine

## 2024-04-25 ENCOUNTER — Ambulatory Visit (INDEPENDENT_AMBULATORY_CARE_PROVIDER_SITE_OTHER): Admitting: Family Medicine

## 2024-04-25 VITALS — BP 142/80 | HR 59 | Temp 99.0°F | Ht 73.54 in | Wt 250.2 lb

## 2024-04-25 DIAGNOSIS — M25519 Pain in unspecified shoulder: Secondary | ICD-10-CM

## 2024-04-25 DIAGNOSIS — M76899 Other specified enthesopathies of unspecified lower limb, excluding foot: Secondary | ICD-10-CM

## 2024-04-25 DIAGNOSIS — Z0000 Encounter for general adult medical examination without abnormal findings: Secondary | ICD-10-CM | POA: Diagnosis not present

## 2024-04-25 DIAGNOSIS — I10 Essential (primary) hypertension: Secondary | ICD-10-CM

## 2024-04-25 DIAGNOSIS — G5710 Meralgia paresthetica, unspecified lower limb: Secondary | ICD-10-CM

## 2024-04-25 DIAGNOSIS — E7800 Pure hypercholesterolemia, unspecified: Secondary | ICD-10-CM

## 2024-04-25 DIAGNOSIS — M720 Palmar fascial fibromatosis [Dupuytren]: Secondary | ICD-10-CM

## 2024-04-25 DIAGNOSIS — Z7189 Other specified counseling: Secondary | ICD-10-CM

## 2024-04-25 NOTE — Patient Instructions (Addendum)
 If you have hand changes then ask about seeing Dr. Watt or we can refer to ortho.   Try adding lateral rows with low weight to see if that helps with shoulder pain.   Keep stretching and update me as needed.  Let me know if you want to try PT.  Take care.  Glad to see you.

## 2024-04-25 NOTE — Progress Notes (Signed)
 CPE- See plan.  Routine anticipatory guidance given to patient.  See health maintenance.  The possibility exists that previously documented standard health maintenance information may have been brought forward from a previous encounter into this note.  If needed, that same information has been updated to reflect the current situation based on today's encounter.    covid vaccine 2021 Tetanus 2022 Flu shot encouraged for fall PNA d/w pt.   shingles prev done.  PSA wnl 2025 Cologuard neg 2024 Diet and exercise d/w pt.   Living will d/w pt. Wife designated if patient were incapacitated. [facility]    Sig shoulder pain after yard work in the fall.  Better in the meantime.  Went to chiropractor but then the reaggravated the area.  He is working on [employer].  He still has audible click with ROM.  See exam.  He felt a pop with stretching his hips prev, he was still able to exercise afterward.  He had B LFCN paresthesia, L side is back to normal.  R side still has abnormal sensation.  Not having back pain.  No leg weakness.    Hypertension:    Using medication without problems or lightheadedness: yes Chest pain with exertion:no Edema:no Short of breath:no Labs d/w pt.    Elevated Cholesterol: Using medications without problems:yes Muscle aches: not likely from statin.  Diet compliance: yes Exercise: yes  T level still wnl. ED related to BP meds.  He can manage as is.    PMH and SH reviewed  Meds, vitals, and allergies reviewed.   ROS: Per HPI.  Unless specifically indicated otherwise in HPI, the patient denies:  General: fever. Eyes: acute vision changes ENT: sore throat Cardiovascular: chest pain Respiratory: SOB GI: vomiting GU: dysuria Musculoskeletal: acute back pain Derm: acute rash Neuro: acute motor dysfunction Psych: worsening mood Endocrine: polydipsia Heme: bleeding Allergy: hayfever  GEN: nad, alert and oriented HEENT: ncat NECK:  supple w/o LA CV: rrr. PULM: ctab, no inc wob ABD: soft, +bs EXT: no edema SKIN: no acute rash He has altered but not absent sensation in the distribution of the right lateral femoral cutaneous nerve.  Normal sensation on the left.  No leg weakness. Mild R 4th finger contracture w/o loss of extension.  R shoulder pain with int rotation with less pain with scapular manipulation.  No arm drop. He has a clunk in the B hips anteriorly with extremes of hip extension.

## 2024-04-28 DIAGNOSIS — M76899 Other specified enthesopathies of unspecified lower limb, excluding foot: Secondary | ICD-10-CM | POA: Insufficient documentation

## 2024-04-28 DIAGNOSIS — G5710 Meralgia paresthetica, unspecified lower limb: Secondary | ICD-10-CM | POA: Insufficient documentation

## 2024-04-28 NOTE — Assessment & Plan Note (Signed)
 He is some better in the meantime.  Discussed that he could try adding lateral rows with low weight to see if that helps with shoulder pain.  If is not better we can refer him to Dr. Watt or physical therapy.  I asked him to update me as needed.  No arm drop.  Okay for outpatient follow-up.

## 2024-04-28 NOTE — Assessment & Plan Note (Signed)
Continue work on diet and exercise.  Continue amlodipine.

## 2024-04-28 NOTE — Assessment & Plan Note (Signed)
 No loss of range of motion.  I asked him to seek follow-up if he noticed changes.

## 2024-04-28 NOTE — Assessment & Plan Note (Signed)
 Living will d/w pt.  Wife designated if patient were incapacitated.   ?

## 2024-04-28 NOTE — Assessment & Plan Note (Signed)
 R meralgia paresthetica, this is some better in the meantime.  Not completely resolved on the right side but it is better.  Resolved on the left.  Discussed options.  We could refer to PT now.  If he continues to have symptoms and does not continue to improve with home exercise program then he can let me know.

## 2024-04-28 NOTE — Assessment & Plan Note (Signed)
 covid vaccine 2021 Tetanus 2022 Flu shot encouraged for fall PNA d/w pt.   shingles prev done.  PSA wnl 2025 Cologuard neg 2024 Diet and exercise d/w pt.   Living will d/w pt. Wife designated if patient were incapacitated. [facility]

## 2024-04-28 NOTE — Assessment & Plan Note (Signed)
 Suspect this is causing the audible symptoms that he has.  He does not have any weakness.  I think it makes sense to keep going with home exercise program and see if this does not gradually improve.  We can refer to PT or orthopedics in the future if needed.  He can update me as needed.  He agrees to plan.

## 2024-04-28 NOTE — Assessment & Plan Note (Signed)
Continue work on diet and exercise.  Continue atorvastatin. 

## 2024-05-07 ENCOUNTER — Ambulatory Visit: Admitting: Cardiology

## 2024-06-12 ENCOUNTER — Ambulatory Visit: Attending: Cardiology | Admitting: Cardiology

## 2024-06-12 ENCOUNTER — Encounter: Payer: Self-pay | Admitting: Cardiology

## 2024-06-12 VITALS — BP 148/88 | HR 61 | Ht 73.0 in | Wt 247.2 lb

## 2024-06-12 DIAGNOSIS — I10 Essential (primary) hypertension: Secondary | ICD-10-CM

## 2024-06-12 DIAGNOSIS — E7800 Pure hypercholesterolemia, unspecified: Secondary | ICD-10-CM

## 2024-06-12 MED ORDER — LOSARTAN POTASSIUM 100 MG PO TABS: 100.0000 mg | ORAL_TABLET | Freq: Every day | ORAL | 3 refills | Status: AC

## 2024-06-12 NOTE — Progress Notes (Signed)
 Cardiology Office Note:    Date:  06/12/2024   ID:  Nicholas Vasquez, DOB 1971-04-06, MRN [id number]  PCP:  Cleatus Arlyss RAMAN, MD   [facility] Providers Cardiologist:  Redell Cave, MD     Referring MD: Cleatus Arlyss RAMAN, MD   Chief Complaint  Patient presents with   Follow-up    3 month follow up visit. Patient is doing well on today. Meds reviewed.     History of Present Illness:    Nicholas Vasquez is a 53 y.o. male with a hx of hypertension, hyperlipidemia who presents for follow-up.   Doing okay, previously seen due to elevated BP in the setting of running out of medications.  Has been taking losartan  and amlodipine  as prescribed.  BP at home still ranges in the 140s systolic.  Overall doing okay, no adverse effects from current medications.  Prior notes coronary calcium  score  05/06/2021 was 69.8. Amlodipine  10 mg daily because edema.  Past Medical History:  Diagnosis Date   History of SCC (squamous cell carcinoma) of skin    HSV infection    oral- episodic, tx as needed    Hyperlipidemia 12/29/2001   Hypertension 05/31/1997    Past Surgical History:  Procedure Laterality Date   VASECTOMY  12/05/2008   Dr. Ike     Current Medications: Current Meds  Medication Sig   amLODipine  (NORVASC ) 5 MG tablet Take 1 tablet (5 mg total) by mouth daily.   atorvastatin  (LIPITOR) 40 MG tablet Take 1 tablet (40 mg total) by mouth daily.   valACYclovir  (VALTREX ) 1000 MG tablet Take 2 tablets (2,000 mg total) by mouth 2 (two) times daily. For 1 day per episode   [DISCONTINUED] losartan  (COZAAR ) 50 MG tablet Take 1 tablet (50 mg total) by mouth daily.     Allergies:   Amlodipine    Social History   Socioeconomic History   Marital status: Married    Spouse name: Not on file   Number of children: 2   Years of education: Not on file   Highest education level: Bachelor's degree (e.g., BA, AB, BS)  Occupational History   Occupation: Conservation officer, nature: [employer]    Comment: [place] GR[employer]  Tobacco Use   Smoking status: Never   Smokeless tobacco: Never  Substance and Sexual Activity   Alcohol use: Yes    Comment: 2 glasses of wine/night    Drug use: No   Sexual activity: Yes    Birth control/protection: Surgical    Comment: Vasectomy  Other Topics Concern   Not on file  Social History Narrative   Frequent exercise   Married 1996, 2 daughters (born 2003 and 2005)   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  Occ travel to [place].     [employer] grad   Social Drivers of Health   Financial Resource Strain: Low Risk  (04/24/2024)   Overall Financial Resource Strain (CARDIA)    Difficulty of Paying Living Expenses: Not hard at all  Food Insecurity: No Food Insecurity (04/24/2024)   Hunger Vital Sign    Worried About Running Out of Food in the Last Year: Never true    Ran Out of Food in the Last Year: Never true  Transportation Needs: No Transportation Needs (04/24/2024)   PRAPARE - Administrator, Civil Service (Medical): No    Lack of Transportation (Non-Medical): No  Physical Activity: Sufficiently Active (04/24/2024)  Exercise Vital Sign    Days of Exercise per Week: 5 days    Minutes of Exercise per Session: 30 min  Stress: Stress Concern Present (04/24/2024)   [employer] of Occupational Health - Occupational Stress Questionnaire    Feeling of Stress: To some extent  Social Connections: Moderately Integrated (04/24/2024)   Social Connection and Isolation Panel    Frequency of Communication with Friends and Family: Once a week    Frequency of Social Gatherings with Friends and Family: Twice a week    Attends Religious Services: Never    Database administrator or Organizations: Yes    Attends Engineer, structural: 1 to 4 times per year    Marital Status: Married     Family History: The patient's family history includes Arthritis in his mother; Coronary  artery disease in his paternal grandfather; Goiter in his sister; Heart attack (age of onset: 81) in his father; Heart disease in his father; Hypertension in his mother; Kidney failure in his paternal grandfather and paternal grandmother; Prostate cancer (age of onset: 92) in his father; Stroke in his father. There is no history of Diabetes, Breast cancer, Ovarian cancer, Uterine cancer, Depression, Drug abuse, Alcohol abuse, or Colon cancer.  ROS:   Please see the history of present illness.     All other systems reviewed and are negative.  EKGs/Labs/Other Studies Reviewed:    The following studies were reviewed today:        Recent Labs: 11/29/2023: Hemoglobin 14.9; Platelets 223.0; TSH 2.07 04/11/2024: ALT 22; BUN 16; Creat 1.11; Potassium 4.9; Sodium 138  Recent Lipid Panel    Component Value Date/Time   CHOL 151 04/11/2024 0734   CHOL 174 09/06/2021 0815   TRIG 58 04/11/2024 0734   HDL 62 04/11/2024 0734   HDL 56 09/06/2021 0815   CHOLHDL 2.4 04/11/2024 0734   VLDL 13.2 04/06/2023 0900   LDLCALC 76 04/11/2024 0734     Risk Assessment/Calculations:          Physical Exam:    VS:  BP (!) 148/88   Pulse 61   Ht 6' 1 (1.854 m)   Wt 247 lb 3.2 oz (112.1 kg)   SpO2 97%   BMI 32.61 kg/m     Wt Readings from Last 3 Encounters:  06/12/24 247 lb 3.2 oz (112.1 kg)  04/25/24 250 lb 3.2 oz (113.5 kg)  02/06/24 248 lb (112.5 kg)     GEN:  Well nourished, well developed in no acute distress HEENT: Normal NECK: No JVD; No carotid bruits CARDIAC: RRR, no murmurs, rubs, gallops RESPIRATORY:  Clear to auscultation without rales, wheezing or rhonchi  ABDOMEN: Soft, non-tender, non-distended MUSCULOSKELETAL:  No edema; No deformity  SKIN: Warm and dry NEUROLOGIC:  Alert and oriented x 3 PSYCHIATRIC:  Normal affect   ASSESSMENT:    1. Primary hypertension   2. Pure hypercholesterolemia    PLAN:    In order of problems listed above:  Hypertension, BP elevated  .increase losartan  to 100 mg daily.  Continue Norvasc  5 mg daily.  Higher doses of amlodipine  previously caused leg edema.   Hyperlipidemia, cholesterol controlled.  Continue Lipitor 40 mg daily.  Follow-up in 3-4 months.  Medication Adjustments/Labs and Tests Ordered: Current medicines are reviewed at length with the patient today.  Concerns regarding medicines are outlined above.  No orders of the defined types were placed in this encounter.   Meds ordered this encounter  Medications   losartan  (COZAAR )  100 MG tablet    Sig: Take 1 tablet (100 mg total) by mouth daily.    Dispense:  90 tablet    Refill:  3     Patient Instructions  Medication Instructions:  - INCREASE losartan  to 100 mg daily   *If you need a refill on your cardiac medications before your next appointment, please call your pharmacy*  Lab Work: No labs ordered today  If you have labs (blood work) drawn today and your tests are completely normal, you will receive your results only by: MyChart Message (if you have MyChart) OR A paper copy in the mail If you have any lab test that is abnormal or we need to change your treatment, we will call you to review the results.  Testing/Procedures: No test ordered today   Follow-Up: At [facility], you and your health needs are our priority.  As part of our continuing mission to provide you with exceptional heart care, our providers are all part of one team.  This team includes your primary Cardiologist (physician) and Advanced Practice Providers or APPs (Physician Assistants and Nurse Practitioners) who all work together to provide you with the care you need, when you need it.  Your next appointment:   4 month(s)  Provider:   You may see Redell Cave, MD or one of the following Advanced Practice Providers on your designated Care Team:   Lonni Meager, NP Lesley Maffucci, PA-C Bernardino Bring, PA-C Cadence [place], PA-C Tylene Lunch, NP Barnie Hila, NP    We recommend signing up for the patient portal called MyChart.  Sign up information is provided on this After Visit Summary.  MyChart is used to connect with patients for Virtual Visits (Telemedicine).  Patients are able to view lab/test results, encounter notes, upcoming appointments, etc.  Non-urgent messages can be sent to your provider as well.   To learn more about what you can do with MyChart, go to [email or web address].      Signed, Redell Cave, MD  06/12/2024 1:05 PM    [facility]

## 2024-06-12 NOTE — Patient Instructions (Signed)
 Medication Instructions:  - INCREASE losartan  to 100 mg daily   *If you need a refill on your cardiac medications before your next appointment, please call your pharmacy*  Lab Work: No labs ordered today  If you have labs (blood work) drawn today and your tests are completely normal, you will receive your results only by: MyChart Message (if you have MyChart) OR A paper copy in the mail If you have any lab test that is abnormal or we need to change your treatment, we will call you to review the results.  Testing/Procedures: No test ordered today   Follow-Up: At [facility], you and your health needs are our priority.  As part of our continuing mission to provide you with exceptional heart care, our providers are all part of one team.  This team includes your primary Cardiologist (physician) and Advanced Practice Providers or APPs (Physician Assistants and Nurse Practitioners) who all work together to provide you with the care you need, when you need it.  Your next appointment:   4 month(s)  Provider:   You may see Redell Cave, MD or one of the following Advanced Practice Providers on your designated Care Team:   Lonni Meager, NP Lesley Maffucci, PA-C Bernardino Bring, PA-C Cadence [place], PA-C Tylene Lunch, NP Barnie Hila, NP    We recommend signing up for the patient portal called MyChart.  Sign up information is provided on this After Visit Summary.  MyChart is used to connect with patients for Virtual Visits (Telemedicine).  Patients are able to view lab/test results, encounter notes, upcoming appointments, etc.  Non-urgent messages can be sent to your provider as well.   To learn more about what you can do with MyChart, go to [email or web address].

## 2024-08-27 ENCOUNTER — Encounter: Payer: Self-pay | Admitting: Family Medicine

## 2024-08-27 DIAGNOSIS — M25519 Pain in unspecified shoulder: Secondary | ICD-10-CM

## 2024-10-21 ENCOUNTER — Ambulatory Visit: Attending: Cardiology | Admitting: Cardiology

## 2024-10-21 ENCOUNTER — Encounter: Payer: Self-pay | Admitting: Cardiology

## 2024-10-21 VITALS — BP 140/68 | HR 57 | Ht 74.0 in | Wt 254.6 lb

## 2024-10-21 DIAGNOSIS — E7800 Pure hypercholesterolemia, unspecified: Secondary | ICD-10-CM

## 2024-10-21 DIAGNOSIS — I10 Essential (primary) hypertension: Secondary | ICD-10-CM | POA: Diagnosis not present

## 2024-10-21 NOTE — Progress Notes (Signed)
 " Cardiology Office Note:    Date:  10/21/2024   ID:  Nicholas Vasquez, DOB [birth year], MRN [id number]  PCP:  Cleatus Arlyss RAMAN, MD   [facility] Providers Cardiologist:  Redell Cave, MD     Referring MD: Cleatus Arlyss RAMAN, MD   Chief Complaint  Patient presents with   Follow-up    4 month follow up pt has been doing well with no complaints of chest pain, chest pressure or SOB, medciation reviewed verbally with patient    History of Present Illness:    Nicholas Vasquez is a 53 y.o. male with a hx of hypertension, hyperlipidemia who presents for follow-up.   Losartan  previously increased to 100 mg daily, tolerating medication no adverse effects.  Also compliant with Norvasc  5 mg daily.  States having a stressful job, also sedentary which causes weight gain and increased BP.  Overall blood pressures have improved since increasing dose of losartan , systolics at home ranges in the 120s to 140s.  Diastolic usually well-controlled in the 70s to 80s.  Prior notes coronary calcium  score  05/06/2021 was 69.8. Amlodipine  10 mg daily because edema.  Past Medical History:  Diagnosis Date   History of SCC (squamous cell carcinoma) of skin    HSV infection    oral- episodic, tx as needed    Hyperlipidemia 12/29/2001   Hypertension 05/31/1997    Past Surgical History:  Procedure Laterality Date   VASECTOMY  12/05/2008   Dr. Ike     Current Medications: Current Meds  Medication Sig   amLODipine  (NORVASC ) 5 MG tablet Take 1 tablet (5 mg total) by mouth daily.   atorvastatin  (LIPITOR) 40 MG tablet Take 1 tablet (40 mg total) by mouth daily.   losartan  (COZAAR ) 100 MG tablet Take 1 tablet (100 mg total) by mouth daily.   valACYclovir  (VALTREX ) 1000 MG tablet Take 2 tablets (2,000 mg total) by mouth 2 (two) times daily. For 1 day per episode     Allergies:   Amlodipine    Social History   Socioeconomic History   Marital status: Married    Spouse name: Not on file   Number of  children: 2   Years of education: Not on file   Highest education level: Bachelor's degree (e.g., BA, AB, BS)  Occupational History   Occupation: Heritage Manager: [employer]    Comment: [place] [employer]  Tobacco Use   Smoking status: Never   Smokeless tobacco: Never  Substance and Sexual Activity   Alcohol use: Yes    Comment: 2 glasses of wine/night    Drug use: No   Sexual activity: Yes    Birth control/protection: Surgical    Comment: Vasectomy  Other Topics Concern   Not on file  Social History Narrative   Frequent exercise   Married 1996, 2 daughters (born 2003 and 2005)   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  Occ travel to [place].     [employer] grad   Social Drivers of Health   Tobacco Use: Low Risk (10/21/2024)   Patient History    Smoking Tobacco Use: Never    Smokeless Tobacco Use: Never    Passive Exposure: Not on file  Financial Resource Strain: Low Risk (04/24/2024)   Overall Financial Resource Strain (CARDIA)    Difficulty of Paying Living Expenses: Not hard at all  Food Insecurity: No Food Insecurity (04/24/2024)   Epic    Worried  About Running Out of Food in the Last Year: Never true    Ran Out of Food in the Last Year: Never true  Transportation Needs: No Transportation Needs (04/24/2024)   Epic    Lack of Transportation (Medical): No    Lack of Transportation (Non-Medical): No  Physical Activity: Sufficiently Active (04/24/2024)   Exercise Vital Sign    Days of Exercise per Week: 5 days    Minutes of Exercise per Session: 30 min  Stress: Stress Concern Present (04/24/2024)   [employer] of Occupational Health - Occupational Stress Questionnaire    Feeling of Stress: To some extent  Social Connections: Moderately Integrated (04/24/2024)   Social Connection and Isolation Panel    Frequency of Communication with Friends and Family: Once a week    Frequency of Social Gatherings with  Friends and Family: Twice a week    Attends Religious Services: Never    Database Administrator or Organizations: Yes    Attends Banker Meetings: 1 to 4 times per year    Marital Status: Married  Depression (PHQ2-9): Low Risk (04/25/2024)   Depression (PHQ2-9)    PHQ-2 Score: 0  Alcohol Screen: Low Risk (04/24/2024)   Alcohol Screen    Last Alcohol Screening Score (AUDIT): 3  Housing: Low Risk (04/24/2024)   Epic    Unable to Pay for Housing in the Last Year: No    Number of Times Moved in the Last Year: 0    Homeless in the Last Year: No  Utilities: Not on file  Health Literacy: Not on file     Family History: The patient's family history includes Arthritis in his mother; Coronary artery disease in his paternal grandfather; Goiter in his sister; Heart attack (age of onset: 22) in his father; Heart disease in his father; Hypertension in his mother; Kidney failure in his paternal grandfather and paternal grandmother; Prostate cancer (age of onset: 26) in his father; Stroke in his father. There is no history of Diabetes, Breast cancer, Ovarian cancer, Uterine cancer, Depression, Drug abuse, Alcohol abuse, or Colon cancer.  ROS:   Please see the history of present illness.     All other systems reviewed and are negative.  EKGs/Labs/Other Studies Reviewed:    The following studies were reviewed today:        Recent Labs: 11/29/2023: Hemoglobin 14.9; Platelets 223.0; TSH 2.07 04/11/2024: ALT 22; BUN 16; Creat 1.11; Potassium 4.9; Sodium 138  Recent Lipid Panel    Component Value Date/Time   CHOL 151 04/11/2024 0734   CHOL 174 09/06/2021 0815   TRIG 58 04/11/2024 0734   HDL 62 04/11/2024 0734   HDL 56 09/06/2021 0815   CHOLHDL 2.4 04/11/2024 0734   VLDL 13.2 04/06/2023 0900   LDLCALC 76 04/11/2024 0734     Risk Assessment/Calculations:          Physical Exam:    VS:  BP (!) 140/68 (BP Location: Left Arm, Patient Position: Sitting, Cuff Size: Normal)    Pulse (!) 57   Ht 6' 2 (1.88 m)   Wt 254 lb 9.6 oz (115.5 kg)   SpO2 99%   BMI 32.69 kg/m     Wt Readings from Last 3 Encounters:  10/21/24 254 lb 9.6 oz (115.5 kg)  06/12/24 247 lb 3.2 oz (112.1 kg)  04/25/24 250 lb 3.2 oz (113.5 kg)     GEN:  Well nourished, well developed in no acute distress HEENT: Normal NECK: No JVD; No carotid  bruits CARDIAC: RRR, no murmurs, rubs, gallops RESPIRATORY:  Clear to auscultation without rales, wheezing or rhonchi  ABDOMEN: Soft, non-tender, non-distended MUSCULOSKELETAL:  No edema; No deformity  SKIN: Warm and dry NEUROLOGIC:  Alert and oriented x 3 PSYCHIATRIC:  Normal affect   ASSESSMENT:    1. Primary hypertension   2. Pure hypercholesterolemia    PLAN:    In order of problems listed above:  Hypertension, BP elevated today, better control at home.  Continue losartan  100 mg daily,Norvasc  5 mg daily.  Consider adding HCTZ if BP becomes elevated at home. Hyperlipidemia, cholesterol controlled.  Continue Lipitor 40 mg daily.  Follow-up in 6 months.  Medication Adjustments/Labs and Tests Ordered: Current medicines are reviewed at length with the patient today.  Concerns regarding medicines are outlined above.  No orders of the defined types were placed in this encounter.   No orders of the defined types were placed in this encounter.    Patient Instructions  Medication Instructions:  Your physician recommends that you continue on your current medications as directed. Please refer to the Current Medication list given to you today.   *If you need a refill on your cardiac medications before your next appointment, please call your pharmacy*  Lab Work: No labs ordered today  If you have labs (blood work) drawn today and your tests are completely normal, you will receive your results only by: MyChart Message (if you have MyChart) OR A paper copy in the mail If you have any lab test that is abnormal or we need to change your  treatment, we will call you to review the results.  Testing/Procedures: No test ordered today   Follow-Up: At [facility], you and your health needs are our priority.  As part of our continuing mission to provide you with exceptional heart care, our providers are all part of one team.  This team includes your primary Cardiologist (physician) and Advanced Practice Providers or APPs (Physician Assistants and Nurse Practitioners) who all work together to provide you with the care you need, when you need it.  Your next appointment:   6 month(s)  Provider:   You may see Redell Cave, MD or one of the following Advanced Practice Providers on your designated Care Team:   Lonni Meager, NP Lesley Maffucci, PA-C Bernardino Bring, PA-C Cadence [place], PA-C Tylene Lunch, NP Barnie Hila, NP    We recommend signing up for the patient portal called MyChart.  Sign up information is provided on this After Visit Summary.  MyChart is used to connect with patients for Virtual Visits (Telemedicine).  Patients are able to view lab/test results, encounter notes, upcoming appointments, etc.  Non-urgent messages can be sent to your provider as well.   To learn more about what you can do with MyChart, go to [email or web address].             Signed, Redell Cave, MD  10/21/2024 8:59 AM    [facility] "

## 2024-10-21 NOTE — Patient Instructions (Signed)

## 2025-04-24 ENCOUNTER — Ambulatory Visit: Admitting: Nurse Practitioner
